# Patient Record
Sex: Male | Born: 1945 | Race: White | Hispanic: No | Marital: Married | Smoking: Never smoker
Health system: Southern US, Community
[De-identification: ages and names within clinical notes are randomized; demographics above are authoritative.]

---

## 2016-10-12 DIAGNOSIS — Z23 Encounter for immunization: Secondary | ICD-10-CM | POA: Diagnosis not present

## 2016-10-27 DIAGNOSIS — M109 Gout, unspecified: Secondary | ICD-10-CM | POA: Diagnosis not present

## 2016-10-27 DIAGNOSIS — Z6829 Body mass index (BMI) 29.0-29.9, adult: Secondary | ICD-10-CM | POA: Diagnosis not present

## 2016-10-27 DIAGNOSIS — I440 Atrioventricular block, first degree: Secondary | ICD-10-CM | POA: Diagnosis not present

## 2016-10-27 DIAGNOSIS — Z23 Encounter for immunization: Secondary | ICD-10-CM | POA: Diagnosis not present

## 2016-10-27 DIAGNOSIS — N400 Enlarged prostate without lower urinary tract symptoms: Secondary | ICD-10-CM | POA: Diagnosis not present

## 2017-07-13 DIAGNOSIS — Z23 Encounter for immunization: Secondary | ICD-10-CM | POA: Diagnosis not present

## 2017-11-05 DIAGNOSIS — J069 Acute upper respiratory infection, unspecified: Secondary | ICD-10-CM | POA: Diagnosis not present

## 2017-11-05 DIAGNOSIS — Z23 Encounter for immunization: Secondary | ICD-10-CM | POA: Diagnosis not present

## 2017-11-05 DIAGNOSIS — Z6829 Body mass index (BMI) 29.0-29.9, adult: Secondary | ICD-10-CM | POA: Diagnosis not present

## 2018-09-27 DIAGNOSIS — Z23 Encounter for immunization: Secondary | ICD-10-CM | POA: Diagnosis not present

## 2019-10-02 ENCOUNTER — Other Ambulatory Visit: Payer: Self-pay | Admitting: Internal Medicine

## 2019-10-02 ENCOUNTER — Ambulatory Visit
Admission: RE | Admit: 2019-10-02 | Discharge: 2019-10-02 | Disposition: A | Discharge status: Home / Self Care | Payer: Medicare Other | Source: Ambulatory Visit | Attending: Internal Medicine | Admitting: Internal Medicine

## 2019-10-02 DIAGNOSIS — M25562 Pain in left knee: Secondary | ICD-10-CM

## 2020-06-10 DIAGNOSIS — M5441 Lumbago with sciatica, right side: Secondary | ICD-10-CM | POA: Diagnosis not present

## 2020-06-19 DIAGNOSIS — M5441 Lumbago with sciatica, right side: Secondary | ICD-10-CM | POA: Diagnosis not present

## 2020-06-19 DIAGNOSIS — R2689 Other abnormalities of gait and mobility: Secondary | ICD-10-CM | POA: Diagnosis not present

## 2020-06-19 DIAGNOSIS — M6281 Muscle weakness (generalized): Secondary | ICD-10-CM | POA: Diagnosis not present

## 2020-06-21 DIAGNOSIS — R2689 Other abnormalities of gait and mobility: Secondary | ICD-10-CM | POA: Diagnosis not present

## 2020-06-21 DIAGNOSIS — M6281 Muscle weakness (generalized): Secondary | ICD-10-CM | POA: Diagnosis not present

## 2020-06-21 DIAGNOSIS — M5441 Lumbago with sciatica, right side: Secondary | ICD-10-CM | POA: Diagnosis not present

## 2020-06-26 DIAGNOSIS — M5441 Lumbago with sciatica, right side: Secondary | ICD-10-CM | POA: Diagnosis not present

## 2020-06-26 DIAGNOSIS — R2689 Other abnormalities of gait and mobility: Secondary | ICD-10-CM | POA: Diagnosis not present

## 2020-06-26 DIAGNOSIS — M6281 Muscle weakness (generalized): Secondary | ICD-10-CM | POA: Diagnosis not present

## 2020-06-29 DIAGNOSIS — Z03818 Encounter for observation for suspected exposure to other biological agents ruled out: Secondary | ICD-10-CM | POA: Diagnosis not present

## 2020-07-03 DIAGNOSIS — M5441 Lumbago with sciatica, right side: Secondary | ICD-10-CM | POA: Diagnosis not present

## 2020-07-03 DIAGNOSIS — R2689 Other abnormalities of gait and mobility: Secondary | ICD-10-CM | POA: Diagnosis not present

## 2020-07-03 DIAGNOSIS — M6281 Muscle weakness (generalized): Secondary | ICD-10-CM | POA: Diagnosis not present

## 2020-07-05 DIAGNOSIS — M5441 Lumbago with sciatica, right side: Secondary | ICD-10-CM | POA: Diagnosis not present

## 2020-07-05 DIAGNOSIS — M6281 Muscle weakness (generalized): Secondary | ICD-10-CM | POA: Diagnosis not present

## 2020-07-05 DIAGNOSIS — R2689 Other abnormalities of gait and mobility: Secondary | ICD-10-CM | POA: Diagnosis not present

## 2020-07-10 DIAGNOSIS — R2689 Other abnormalities of gait and mobility: Secondary | ICD-10-CM | POA: Diagnosis not present

## 2020-07-10 DIAGNOSIS — M5441 Lumbago with sciatica, right side: Secondary | ICD-10-CM | POA: Diagnosis not present

## 2020-07-10 DIAGNOSIS — M6281 Muscle weakness (generalized): Secondary | ICD-10-CM | POA: Diagnosis not present

## 2020-07-19 DIAGNOSIS — M6281 Muscle weakness (generalized): Secondary | ICD-10-CM | POA: Diagnosis not present

## 2020-07-19 DIAGNOSIS — M5441 Lumbago with sciatica, right side: Secondary | ICD-10-CM | POA: Diagnosis not present

## 2020-07-19 DIAGNOSIS — R2689 Other abnormalities of gait and mobility: Secondary | ICD-10-CM | POA: Diagnosis not present

## 2020-07-24 DIAGNOSIS — M5116 Intervertebral disc disorders with radiculopathy, lumbar region: Secondary | ICD-10-CM | POA: Diagnosis not present

## 2020-07-26 DIAGNOSIS — M6281 Muscle weakness (generalized): Secondary | ICD-10-CM | POA: Diagnosis not present

## 2020-07-26 DIAGNOSIS — M5441 Lumbago with sciatica, right side: Secondary | ICD-10-CM | POA: Diagnosis not present

## 2020-07-26 DIAGNOSIS — R2689 Other abnormalities of gait and mobility: Secondary | ICD-10-CM | POA: Diagnosis not present

## 2020-07-31 DIAGNOSIS — R2689 Other abnormalities of gait and mobility: Secondary | ICD-10-CM | POA: Diagnosis not present

## 2020-07-31 DIAGNOSIS — M5441 Lumbago with sciatica, right side: Secondary | ICD-10-CM | POA: Diagnosis not present

## 2020-07-31 DIAGNOSIS — M6281 Muscle weakness (generalized): Secondary | ICD-10-CM | POA: Diagnosis not present

## 2020-09-23 DIAGNOSIS — I2510 Atherosclerotic heart disease of native coronary artery without angina pectoris: Secondary | ICD-10-CM | POA: Diagnosis not present

## 2020-10-09 DIAGNOSIS — I2510 Atherosclerotic heart disease of native coronary artery without angina pectoris: Secondary | ICD-10-CM | POA: Diagnosis not present

## 2020-10-15 DIAGNOSIS — I2510 Atherosclerotic heart disease of native coronary artery without angina pectoris: Secondary | ICD-10-CM | POA: Diagnosis not present

## 2020-10-16 DIAGNOSIS — I2510 Atherosclerotic heart disease of native coronary artery without angina pectoris: Secondary | ICD-10-CM | POA: Diagnosis not present

## 2020-10-21 DIAGNOSIS — I2510 Atherosclerotic heart disease of native coronary artery without angina pectoris: Secondary | ICD-10-CM | POA: Diagnosis not present

## 2020-10-21 DIAGNOSIS — I442 Atrioventricular block, complete: Secondary | ICD-10-CM | POA: Diagnosis not present

## 2020-10-30 DIAGNOSIS — I2510 Atherosclerotic heart disease of native coronary artery without angina pectoris: Secondary | ICD-10-CM | POA: Diagnosis not present

## 2020-12-09 DIAGNOSIS — R7303 Prediabetes: Secondary | ICD-10-CM | POA: Diagnosis not present

## 2020-12-09 DIAGNOSIS — I7091 Generalized atherosclerosis: Secondary | ICD-10-CM | POA: Diagnosis not present

## 2020-12-09 DIAGNOSIS — Z0000 Encounter for general adult medical examination without abnormal findings: Secondary | ICD-10-CM | POA: Diagnosis not present

## 2020-12-09 DIAGNOSIS — Z1389 Encounter for screening for other disorder: Secondary | ICD-10-CM | POA: Diagnosis not present

## 2020-12-09 DIAGNOSIS — M109 Gout, unspecified: Secondary | ICD-10-CM | POA: Diagnosis not present

## 2021-01-06 ENCOUNTER — Other Ambulatory Visit: Payer: Self-pay

## 2021-01-06 ENCOUNTER — Ambulatory Visit: Payer: Medicare Other | Admitting: Internal Medicine

## 2021-01-06 ENCOUNTER — Encounter: Payer: Self-pay | Admitting: Internal Medicine

## 2021-01-06 VITALS — BP 122/70 | HR 59 | Ht 73.0 in | Wt 225.4 lb

## 2021-01-06 DIAGNOSIS — Z950 Presence of cardiac pacemaker: Secondary | ICD-10-CM | POA: Diagnosis not present

## 2021-01-06 DIAGNOSIS — I495 Sick sinus syndrome: Secondary | ICD-10-CM | POA: Diagnosis not present

## 2021-01-06 NOTE — Patient Instructions (Addendum)
Medication Instructions:  °Your physician recommends that you continue on your current medications as directed. Please refer to the Current Medication list given to you today. ° °Labwork: °None ordered. ° °Testing/Procedures: °None ordered. ° °Follow-Up: °Your physician wants you to follow-up in: one year with Gregg Taylor, MD  °You will receive a reminder letter in the mail two months in advance. If you don't receive a letter, please call our office to schedule the follow-up appointment. ° °Remote monitoring is used to monitor your Pacemaker from home. This monitoring reduces the number of office visits required to check your device to one time per year. It allows us to keep an eye on the functioning of your device to ensure it is working properly. You are scheduled for a device check from home on 04/07/2021. You may send your transmission at any time that day. If you have a wireless device, the transmission will be sent automatically. After your physician reviews your transmission, you will receive a postcard with your next transmission date. ° °Any Other Special Instructions Will Be Listed Below (If Applicable). ° °If you need a refill on your cardiac medications before your next appointment, please call your pharmacy.  ° ° ° ° °

## 2021-01-06 NOTE — Progress Notes (Signed)
HPI Anthony Huynh is referred today for evaluation and management of his DDD PM. 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 He feels well and denies chest pain or sob. He is active and has no limit. He had no trouble healing after his PPM insertion.  Allergies  Allergen Reactions   Bee Venom Other (See Comments)     Current Outpatient Medications  Medication Sig Dispense Refill   allopurinol (ZYLOPRIM) 100 MG tablet 1 tablet     aspirin 81 MG chewable tablet 1 tab     B Complex Vitamins (B COMPLEX 1 PO) Take 1 tablet by mouth See admin instructions.     Black Pepper-Turmeric (TURMERIC CURCUMIN) 05-998 MG CAPS See admin instructions.     Multiple Vitamin (MULTIVITAMIN ADULT) TABS 1 tablet     Omega-3 Fatty Acids (FISH OIL) 1000 MG CAPS 1 capsule     Pitavastatin Calcium (LIVALO) 2 MG TABS      TAMSULOSIN HCL PO      vitamin B-12 (CYANOCOBALAMIN) 100 MCG tablet 1 tablet     No current facility-administered medications for this visit.     History reviewed. No pertinent past medical history.  ROS:   All systems reviewed and negative except as noted in the HPI.   History reviewed. No pertinent surgical history.   History reviewed. No pertinent family history.   Social History   Socioeconomic History   Marital status: Married    Spouse name: Not on file   Number of children: Not on file   Years of education: Not on file   Highest education level: Not on file  Occupational History   Not on file  Tobacco Use   Smoking status: Never   Smokeless tobacco: Never  Vaping Use   Vaping Use: Never used  Substance and Sexual Activity   Alcohol use: Yes    Alcohol/week: 3.0 standard drinks    Types: 1 Glasses of wine, 1 Cans of beer, 1 Standard drinks or equivalent per week    Comment: daily   Drug use: Never   Sexual activity: Yes  Other  Topics Concern   Not on file  Social History Narrative   Not on file   Social Determinants of Health   Financial Resource Strain: Not on file  Food Insecurity: Not on file  Transportation Needs: Not on file  Physical Activity: Not on file  Stress: Not on file  Social Connections: Not on file  Intimate Partner Violence: Not on file     BP 122/70    Pulse (!) 59    Ht 6\' 1"  (1.854 m)    Wt 225 lb 6.4 oz (102.2 kg)    SpO2 97%    BMI 29.74 kg/m   Physical Exam:  Well appearing NAD HEENT: Unremarkable Neck:  No JVD, no thyromegally Lymphatics:  No adenopathy Back:  No CVA tenderness Lungs:  Clear HEART:  Regular rate rhythm, no murmurs, no rubs, no clicks Abd:  soft, positive bowel sounds, no organomegally, no rebound, no guarding Ext:  2 plus pulses, no edema, no cyanosis, no clubbing Skin:  No rashes no nodules Neuro:  CN II through XII intact, motor grossly intact  EKG - nsr with first degree AV block.   DEVICE  Normal device function.  See PaceArt for details.  Assess/Plan:  Heart block - he is pacing despite a prolonged AV delay and I have tightened up his AV delay. I asked him to call if he did not like the programming changes. 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 CAD - he has single vessel disease and denies anginal symptoms.   Anthony Gowda Jil Penland,MD

## 2021-01-08 ENCOUNTER — Telehealth: Payer: Self-pay

## 2021-01-08 NOTE — Telephone Encounter (Signed)
Successful telephone encounter to patient to inform patient his remote monitor has been ordered and will be delivered to his address. Discussed remote monitoring process. Patient appreciative of call. Will continue to monitor remotely as patient intermittently relocates to [place].

## 2021-01-14 ENCOUNTER — Other Ambulatory Visit: Payer: Self-pay

## 2021-01-14 ENCOUNTER — Encounter (HOSPITAL_COMMUNITY): Payer: Self-pay | Admitting: Emergency Medicine

## 2021-01-14 ENCOUNTER — Emergency Department (HOSPITAL_COMMUNITY)
Admission: EM | Admit: 2021-01-14 | Discharge: 2021-01-14 | Disposition: A | Discharge status: Home / Self Care | Payer: Medicare Other | Attending: Emergency Medicine | Admitting: Emergency Medicine

## 2021-01-14 ENCOUNTER — Emergency Department (HOSPITAL_COMMUNITY): Payer: Medicare Other

## 2021-01-14 DIAGNOSIS — Z7982 Long term (current) use of aspirin: Secondary | ICD-10-CM | POA: Insufficient documentation

## 2021-01-14 DIAGNOSIS — N179 Acute kidney failure, unspecified: Secondary | ICD-10-CM | POA: Diagnosis not present

## 2021-01-14 DIAGNOSIS — M542 Cervicalgia: Secondary | ICD-10-CM | POA: Diagnosis not present

## 2021-01-14 DIAGNOSIS — E860 Dehydration: Secondary | ICD-10-CM | POA: Insufficient documentation

## 2021-01-14 DIAGNOSIS — Z8546 Personal history of malignant neoplasm of prostate: Secondary | ICD-10-CM | POA: Diagnosis not present

## 2021-01-14 DIAGNOSIS — Z950 Presence of cardiac pacemaker: Secondary | ICD-10-CM | POA: Diagnosis not present

## 2021-01-14 DIAGNOSIS — R630 Anorexia: Secondary | ICD-10-CM | POA: Insufficient documentation

## 2021-01-14 DIAGNOSIS — S199XXA Unspecified injury of neck, initial encounter: Secondary | ICD-10-CM | POA: Diagnosis not present

## 2021-01-14 DIAGNOSIS — Z743 Need for continuous supervision: Secondary | ICD-10-CM | POA: Diagnosis not present

## 2021-01-14 DIAGNOSIS — J3489 Other specified disorders of nose and nasal sinuses: Secondary | ICD-10-CM | POA: Diagnosis not present

## 2021-01-14 DIAGNOSIS — S00532A Contusion of oral cavity, initial encounter: Secondary | ICD-10-CM | POA: Insufficient documentation

## 2021-01-14 DIAGNOSIS — S0083XA Contusion of other part of head, initial encounter: Secondary | ICD-10-CM | POA: Diagnosis not present

## 2021-01-14 DIAGNOSIS — J329 Chronic sinusitis, unspecified: Secondary | ICD-10-CM | POA: Diagnosis not present

## 2021-01-14 DIAGNOSIS — W01198A Fall on same level from slipping, tripping and stumbling with subsequent striking against other object, initial encounter: Secondary | ICD-10-CM | POA: Insufficient documentation

## 2021-01-14 DIAGNOSIS — R404 Transient alteration of awareness: Secondary | ICD-10-CM | POA: Diagnosis not present

## 2021-01-14 DIAGNOSIS — R55 Syncope and collapse: Secondary | ICD-10-CM | POA: Insufficient documentation

## 2021-01-14 DIAGNOSIS — S0990XA Unspecified injury of head, initial encounter: Secondary | ICD-10-CM | POA: Diagnosis present

## 2021-01-14 LAB — URINALYSIS, ROUTINE W REFLEX MICROSCOPIC
Bilirubin Urine: NEGATIVE
Glucose, UA: NEGATIVE mg/dL
Hgb urine dipstick: NEGATIVE
Ketones, ur: NEGATIVE mg/dL
Leukocytes,Ua: NEGATIVE
Nitrite: NEGATIVE
Protein, ur: NEGATIVE mg/dL
Specific Gravity, Urine: 1.02 (ref 1.005–1.030)
pH: 6 (ref 5.0–8.0)

## 2021-01-14 LAB — BASIC METABOLIC PANEL
Anion gap: 6 (ref 5–15)
BUN: 14 mg/dL (ref 8–23)
CO2: 25 mmol/L (ref 22–32)
Calcium: 8.3 mg/dL — ABNORMAL LOW (ref 8.9–10.3)
Chloride: 105 mmol/L (ref 98–111)
Creatinine, Ser: 1.29 mg/dL — ABNORMAL HIGH (ref 0.61–1.24)
GFR, Estimated: 57 mL/min — ABNORMAL LOW (ref >60)
Glucose, Bld: 102 mg/dL — ABNORMAL HIGH (ref 70–99)
Potassium: 3.6 mmol/L (ref 3.5–5.1)
Sodium: 136 mmol/L (ref 135–145)

## 2021-01-14 LAB — CBC
HCT: 41.3 % (ref 39.0–52.0)
Hemoglobin: 14 g/dL (ref 13.0–17.0)
MCH: 32.1 pg (ref 26.0–34.0)
MCHC: 33.9 g/dL (ref 30.0–36.0)
MCV: 94.7 fL (ref 80.0–100.0)
Platelets: 142 10*3/uL — ABNORMAL LOW (ref 150–400)
RBC: 4.36 MIL/uL (ref 4.22–5.81)
RDW: 12.1 % (ref 11.5–15.5)
WBC: 8 10*3/uL (ref 4.0–10.5)
nRBC: 0 % (ref 0.0–0.2)

## 2021-01-14 LAB — CBG MONITORING, ED: Glucose-Capillary: 107 mg/dL — ABNORMAL HIGH (ref 70–99)

## 2021-01-14 MED ORDER — IBUPROFEN 200 MG PO TABS
400.0000 mg | ORAL_TABLET | Freq: Once | ORAL | Status: DC
  Filled 2021-01-14: qty 2

## 2021-01-14 MED ORDER — PSEUDOEPHEDRINE HCL 15 MG/5ML PO LIQD
30.0000 mg | Freq: Once | ORAL | Status: AC
  Administered 2021-01-14: 04:00:00 30 mg via ORAL
  Filled 2021-01-14: qty 10

## 2021-01-14 MED ORDER — OXYCODONE-ACETAMINOPHEN 5-325 MG PO TABS
2.0000 | ORAL_TABLET | Freq: Once | ORAL | Status: AC
  Administered 2021-01-14: 03:00:00 2 via ORAL
  Filled 2021-01-14: qty 2

## 2021-01-14 MED ORDER — LACTATED RINGERS IV BOLUS
1000.0000 mL | Freq: Once | INTRAVENOUS | Status: AC
  Administered 2021-01-14: 02:00:00 1000 mL via INTRAVENOUS

## 2021-01-14 NOTE — ED Notes (Signed)
No arrhythmias detected since 01/07/2021. Batteries all charged. Dual chamber pacemaker working perfect. Pacemaker  has "Optivol" feature that monitors intrathoracic impedance and is trending up per [employer] staff.

## 2021-01-14 NOTE — ED Notes (Addendum)
Pt ambulatory to restroom denies dizziness but says he doesn't feel like he is at baseline.

## 2021-01-14 NOTE — ED Provider Notes (Signed)
[facility]EMERGENCY DEPT Provider Note   CSN: [id number] Arrival date & time: 01/14/21  0116     History Chief Complaint  Patient presents with   Fall   Loss of Consciousness    Anthony Huynh. is a 75 y.o. male.  75 year old male the presents emerged from today after a fall.  Patient states that he often gets up to urinate at night secondary to prostate cancer and enlarged prostate.  Patient presumes that is what is happening tonight.  He remembers sitting on the bed but then he woke up on the ground.  He states that he hit the front of his head on what he thinks was his nightstand but he also has some lower cervical spine pain and tenderness.  States he feels like he strained his left shoulder.  Had a pacemaker placed for sick sinus syndrome back in October without any issues since then.  Has had a sinus infection recently but without fever.  Has had decreased p.o. intake and complains of dry mouth at this time but no dark urine or change in urination patterns.  Patient has no chest pain or back pain.  He has the pain with a trauma to his forehead but no other headaches.  No palpitations.  No other associated symptoms.   Fall  Loss of Consciousness     History reviewed. No pertinent past medical history.  Patient Active Problem List   Diagnosis Date Noted   Sick sinus syndrome (HCC) 01/06/2021   Pacemaker 01/06/2021    History reviewed. No pertinent surgical history.     History reviewed. No pertinent family history.  Social History   Tobacco Use   Smoking status: Never   Smokeless tobacco: Never  Vaping Use   Vaping Use: Never used  Substance Use Topics   Alcohol use: Yes    Alcohol/week: 3.0 standard drinks    Types: 1 Glasses of wine, 1 Cans of beer, 1 Standard drinks or equivalent per week    Comment: daily   Drug use: Never    Home Medications Prior to Admission medications   Medication Sig Start Date End Date Taking?  Authorizing Provider  allopurinol (ZYLOPRIM) 100 MG tablet Take 100 mg by mouth daily.   Yes [provider]  aspirin 81 MG chewable tablet Chew 81 mg by mouth daily.   Yes [provider]  B Complex Vitamins (B COMPLEX 1 PO) Take 1 tablet by mouth daily.   Yes [provider]  Black Pepper-Turmeric (TURMERIC CURCUMIN) 05-998 MG CAPS Take 1 capsule by mouth daily.   Yes [provider]  Coenzyme Q10 (CO Q-10 PO) Take 1 capsule by mouth daily.   Yes [provider]  Multiple Vitamin (MULTIVITAMIN ADULT) TABS Take 1 tablet by mouth daily.   Yes [provider]  Omega-3 Fatty Acids (FISH OIL) 1000 MG CAPS Take 1,000 mg by mouth daily.   Yes [provider]  Pitavastatin Calcium (LIVALO) 2 MG TABS Take 2 mg by mouth daily. 10/19/20  Yes [provider]  tamsulosin (FLOMAX) 0.4 MG CAPS capsule Take 0.4 mg by mouth daily. 12/17/19  Yes [provider]  vitamin B-12 (CYANOCOBALAMIN) 100 MCG tablet Take 100 mcg by mouth daily.   Yes [provider]    Allergies    Bee venom  Review of Systems   Review of Systems  Cardiovascular:  Positive for syncope.  All other systems reviewed and are negative.  Physical Exam Updated Vital  Signs BP (!) 167/94    Pulse 71    Temp 98.2 F (36.8 C) (Oral)    Resp 20    Ht 6\' 1"  (1.854 m)    Wt 102 kg    SpO2 97%    BMI 29.67 kg/m   Physical Exam Vitals and nursing note reviewed.  Constitutional:      Appearance: He is well-developed.  HENT:     Head: Normocephalic.     Comments: Ecchymosis and dried blood to his right forehead with abrasions but no obvious lacerations    Nose: Nose normal. No congestion or rhinorrhea.     Mouth/Throat:     Mouth: Mucous membranes are dry.     Pharynx: Oropharynx is clear.     Comments: Bruising all around the periphery of his tongue without laceration. Eyes:     Pupils: Pupils are equal, round, and reactive to light.   Cardiovascular:     Rate and Rhythm: Normal rate.  Pulmonary:     Effort: Pulmonary effort is normal. No respiratory distress.  Abdominal:     General: There is no distension.  Musculoskeletal:        General: Tenderness (Left paracervical and C6/7 midline area) present. Normal range of motion.     Cervical back: Normal range of motion.  Skin:    General: Skin is warm and dry.  Neurological:     General: No focal deficit present.     Mental Status: He is alert and oriented to person, place, and time.    ED Results / Procedures / Treatments   Labs (all labs ordered are listed, but only abnormal results are displayed) Labs Reviewed  BASIC METABOLIC PANEL - Abnormal; Notable for the following components:      Result Value   Glucose, Bld 102 (*)    Creatinine, Ser 1.29 (*)    Calcium 8.3 (*)    GFR, Estimated 57 (*)    All other components within normal limits  CBC - Abnormal; Notable for the following components:   Platelets 142 (*)    All other components within normal limits  CBG MONITORING, ED - Abnormal; Notable for the following components:   Glucose-Capillary 107 (*)    All other components within normal limits  URINALYSIS, ROUTINE W REFLEX MICROSCOPIC  CBG MONITORING, ED    EKG EKG Interpretation  Date/Time:  Tuesday January 14 2021 01:34:27 EST Ventricular Rate:  62 PR Interval:  208 QRS Duration: 100 QT Interval:  440 QTC Calculation: 446 R Axis:   -71 Text Interpretation: AV dual-paced rhythm Abnormal ECG Confirmed by [id number] [phone]) on 01/14/2021 4:33:52 AM  Radiology CT Head Wo Contrast  Result Date: 01/14/2021 CLINICAL DATA:  Trauma. EXAM: CT HEAD WITHOUT CONTRAST CT CERVICAL SPINE WITHOUT CONTRAST TECHNIQUE: Multidetector CT imaging of the head and cervical spine was performed following the standard protocol without intravenous contrast. Multiplanar CT image reconstructions of the cervical spine were also generated. COMPARISON:  None. FINDINGS:  CT HEAD FINDINGS Brain: The ventricles and sulci are appropriate size for the patient's age. The gray-white matter discrimination is preserved. There is no acute intracranial hemorrhage. No mass effect or midline shift. No extra-axial fluid collection. Vascular: No hyperdense vessel or unexpected calcification. Skull: Normal. Negative for fracture or focal lesion. Sinuses/Orbits: There is diffuse mucoperiosteal thickening of paranasal sinuses and opacification of the right frontal sinus. Small left maxillary sinus air-fluid level. The mastoid air cells are clear. Other: Mild right forehead contusion. CT CERVICAL SPINE  FINDINGS Alignment: No acute subluxation. Skull base and vertebrae: No acute fracture.  Osteopenia. Soft tissues and spinal canal: No prevertebral fluid or swelling. No visible canal hematoma. Disc levels:  No acute findings.  Mild degenerative changes. Upper chest: Negative. Other: Bilateral carotid bulb calcified plaques. Partially visualized pacemaker wires. A 2 cm left thyroid hypodense lesion. In the setting of significant comorbidities or limited life expectancy, no follow-up recommended (ref: J Am Coll Radiol. 2015 Feb;12(2): 143-50). IMPRESSION: 1. No acute intracranial pathology. 2. No acute/traumatic cervical spine pathology. 3. Paranasal sinus disease. Electronically Signed   By: Elgie Collard M.D.   On: 01/14/2021 04:00   CT Cervical Spine Wo Contrast  Result Date: 01/14/2021 CLINICAL DATA:  Trauma. EXAM: CT HEAD WITHOUT CONTRAST CT CERVICAL SPINE WITHOUT CONTRAST TECHNIQUE: Multidetector CT imaging of the head and cervical spine was performed following the standard protocol without intravenous contrast. Multiplanar CT image reconstructions of the cervical spine were also generated. COMPARISON:  None. FINDINGS: CT HEAD FINDINGS Brain: The ventricles and sulci are appropriate size for the patient's age. The gray-white matter discrimination is preserved. There is no acute intracranial  hemorrhage. No mass effect or midline shift. No extra-axial fluid collection. Vascular: No hyperdense vessel or unexpected calcification. Skull: Normal. Negative for fracture or focal lesion. Sinuses/Orbits: There is diffuse mucoperiosteal thickening of paranasal sinuses and opacification of the right frontal sinus. Small left maxillary sinus air-fluid level. The mastoid air cells are clear. Other: Mild right forehead contusion. CT CERVICAL SPINE FINDINGS Alignment: No acute subluxation. Skull base and vertebrae: No acute fracture.  Osteopenia. Soft tissues and spinal canal: No prevertebral fluid or swelling. No visible canal hematoma. Disc levels:  No acute findings.  Mild degenerative changes. Upper chest: Negative. Other: Bilateral carotid bulb calcified plaques. Partially visualized pacemaker wires. A 2 cm left thyroid hypodense lesion. In the setting of significant comorbidities or limited life expectancy, no follow-up recommended (ref: J Am Coll Radiol. 2015 Feb;12(2): 143-50). IMPRESSION: 1. No acute intracranial pathology. 2. No acute/traumatic cervical spine pathology. 3. Paranasal sinus disease. Electronically Signed   By: Elgie Collard M.D.   On: 01/14/2021 04:00    Procedures Procedures   Medications Ordered in ED Medications  ibuprofen (ADVIL) tablet 400 mg (400 mg Oral Not Given 01/14/21 0403)  lactated ringers bolus 1,000 mL (0 mLs Intravenous Stopped 01/14/21 0404)  oxyCODONE-acetaminophen (PERCOCET/ROXICET) 5-325 MG per tablet 2 tablet (2 tablets Oral Given 01/14/21 0327)  pseudoephedrine (SUDAFED) 15 MG/5ML liquid 30 mg (30 mg Oral Given 01/14/21 0357)    ED Course  I have reviewed the triage vital signs and the nursing notes.  Pertinent labs & imaging results that were available during my care of the patient were reviewed by me and considered in my medical decision making (see chart for details).    MDM Rules/Calculators/A&P                         Suspect patient had  hypovolemic syncope and bit his tongue on the way down.  Wife does not report any seizure-like activity or postictal state.  Blood sugars with EMS and here are normal.  With a tongue biting could also consider seizure however seems less likely in his situation.  Will CT for injuries and otherwise normal syncopal work-up along with interrogate pacemaker.  Patient will likely be stable for discharge if all this is reassuring.  Work-up is reassuring as above.  Mild AKI but will follow-up with his doctor  to reevaluate that about a week.  No other acute or worsening symptoms.   Final Clinical Impression(s) / ED Diagnoses Final diagnoses:  Syncope and collapse  Dehydration    Rx / DC Orders ED Discharge Orders     None        Alohilani Levenhagen, Barbara Cower, MD 01/14/21 [phone]

## 2021-01-14 NOTE — ED Notes (Signed)
Labeled specimen cup provided to pt for urine collection per MD order. ENMiles 

## 2021-01-14 NOTE — ED Notes (Signed)
[employer]tronic pacemaker interrogated. Transmission send completed.

## 2021-01-14 NOTE — ED Triage Notes (Signed)
Pt arrived via EMS from home. Pt lost consciousness and fell, hitting the right side of his head. Pt had normal CBG. Pt is A&Ox4. Pt reports sinus congestion for the past 2 days

## 2021-01-21 DIAGNOSIS — M25512 Pain in left shoulder: Secondary | ICD-10-CM | POA: Diagnosis not present

## 2021-01-21 DIAGNOSIS — N179 Acute kidney failure, unspecified: Secondary | ICD-10-CM | POA: Diagnosis not present

## 2021-01-29 DIAGNOSIS — R350 Frequency of micturition: Secondary | ICD-10-CM | POA: Diagnosis not present

## 2021-02-03 ENCOUNTER — Telehealth: Payer: Self-pay | Admitting: Internal Medicine

## 2021-02-03 NOTE — Telephone Encounter (Signed)
° ° °  1. Has your device fired?   2. Is you device beeping?   3. Are you experiencing draining or swelling at device site?   4. Are you calling to see if we received your device transmission? Pt would like to r/s his transmission appt to 05/12/21 he said he will ne out of the country. Also, he wanted to know how to use his hand held monitor   5. Have you passed out?     Please route to Device Clinic Pool

## 2021-02-04 ENCOUNTER — Other Ambulatory Visit: Payer: Self-pay | Admitting: Interventional Cardiology

## 2021-02-04 ENCOUNTER — Ambulatory Visit (INDEPENDENT_AMBULATORY_CARE_PROVIDER_SITE_OTHER): Payer: Medicare Other

## 2021-02-04 ENCOUNTER — Telehealth: Payer: Self-pay

## 2021-02-04 DIAGNOSIS — I4891 Unspecified atrial fibrillation: Secondary | ICD-10-CM

## 2021-02-04 DIAGNOSIS — I495 Sick sinus syndrome: Secondary | ICD-10-CM | POA: Diagnosis not present

## 2021-02-04 LAB — CUP PACEART REMOTE DEVICE CHECK
Battery Remaining Longevity: 164 mo
Battery Voltage: 3.2 V
Brady Statistic AP VP Percent: 39.45 %
Brady Statistic AP VS Percent: 0 %
Brady Statistic AS VP Percent: 60.18 %
Brady Statistic AS VS Percent: 0.29 %
Brady Statistic RA Percent Paced: 26.71 %
Brady Statistic RV Percent Paced: 94.84 %
Date Time Interrogation Session: 20230116153413
Implantable Lead Implant Date: 20221005
Implantable Lead Implant Date: 20221005
Implantable Lead Location: 753859
Implantable Lead Location: 753860
Implantable Lead Model: 3830
Implantable Lead Model: 4574
Implantable Pulse Generator Implant Date: 20221005
Lead Channel Impedance Value: 342 Ohm
Lead Channel Impedance Value: 399 Ohm
Lead Channel Impedance Value: 437 Ohm
Lead Channel Impedance Value: 551 Ohm
Lead Channel Pacing Threshold Amplitude: 0.375 V
Lead Channel Pacing Threshold Amplitude: 0.75 V
Lead Channel Pacing Threshold Pulse Width: 0.4 ms
Lead Channel Pacing Threshold Pulse Width: 0.4 ms
Lead Channel Sensing Intrinsic Amplitude: 14.125 mV
Lead Channel Sensing Intrinsic Amplitude: 14.125 mV
Lead Channel Sensing Intrinsic Amplitude: 5.125 mV
Lead Channel Sensing Intrinsic Amplitude: 5.125 mV
Lead Channel Setting Pacing Amplitude: 1.5 V
Lead Channel Setting Pacing Amplitude: 2 V
Lead Channel Setting Pacing Pulse Width: 0.4 ms
Lead Channel Setting Sensing Sensitivity: 0.9 mV

## 2021-02-04 MED ORDER — APIXABAN 5 MG PO TABS: 5.0000 mg | ORAL_TABLET | Freq: Two times a day (BID) | ORAL | 0 refills | Status: DC

## 2021-02-04 MED ORDER — APIXABAN 2.5 MG PO TABS: 5.0000 mg | ORAL_TABLET | Freq: Two times a day (BID) | ORAL | Status: DC

## 2021-02-04 NOTE — Telephone Encounter (Signed)
Patient does not have a part D plan. He has 2 months of Eliquis (we gave him 1 month and a coupon for a free 1 month) He lives part time in [place] and has a doctor over there. He is going to try to see if medication is cheaper over there.

## 2021-02-04 NOTE — Telephone Encounter (Signed)
Prescription refill request for Eliquis received.  Last office visit: Anthony Huynh, 01/06/2021 Scr: 1.29, 01/14/2021 Age: 76 yo  Weight: 102 kg

## 2021-02-04 NOTE — Telephone Encounter (Signed)
I spoke with the patient and let him know that he can use the monitor every 91 days to send his remote transmissions. I told him when he feels like he is having an arrhythmia he can use the monitor than as well. He can do the remote any time of the day and he do not have to call or come to the office to do the transmission. I let him know the monitor will work wherever there is a cell tower. He wanted to change his April appointment. I change it for him. The patient verbalized understanding and thanked me for my help.

## 2021-02-04 NOTE — Telephone Encounter (Addendum)
Scheduled remote reviewed. Normal device function.   AF burden 32%, 1 persistent episode on 01/21/21 with duration approx 7 days per cardiac compass (new onset, no history noted in record). Rates controlled. 1 NSVT, 8 beats. Routed for further review of AF       Spoke with patient offered appointment next week, patient stated he was flying out to [place] tomorrow afternoon,and would be in [place] for 4 months. No appointments available to fit patients time frame,patient stated that he could not delay his trip.   Patients history reviewed with Dr Eldridge Dace, recommended for patient to start Eliquis 5mg  BID, stop ASA 81mg . Reviewed bleeding precautions and when to seek medical attention with patient, patient voiced understanding.   Patient advised to keep our practice informed via MyChart as patient does have a cardiologist in patient voiced understanding.

## 2021-02-05 NOTE — Telephone Encounter (Signed)
Hopefully, he can f/u with his physician in [place] also.  Will include Dr. Ladona Ridgel on the message so he is aware Eliquis was started for stroke prevention.  Aspirin was stopped.  Details of the CAD were unclear.  No known recent PCI so will plan on Eliquis alone.

## 2021-02-06 ENCOUNTER — Telehealth: Payer: Self-pay | Admitting: Internal Medicine

## 2021-02-06 NOTE — Telephone Encounter (Signed)
LMOVM with the information [employer] is requesting. I left device clinic direct number to call if he has any further questions.

## 2021-02-06 NOTE — Telephone Encounter (Signed)
Joven from [employer] calling to verify the date on implantation, managing doctor and the medtronic leads serial number. They state it is okay to leave a voicemail with the information. Phone: [phone]

## 2021-02-17 NOTE — Progress Notes (Signed)
Remote pacemaker transmission.   

## 2021-03-03 ENCOUNTER — Other Ambulatory Visit: Payer: Self-pay | Admitting: Interventional Cardiology

## 2021-03-03 NOTE — Telephone Encounter (Signed)
Prescription refill request for Eliquis received. Indication: afib  Last office visit: 01/06/2021, Ladona Ridgel Scr: 1.29, 01/14/2021 Age: 76 yo  Weight: 102 kg   See telephone note from 02/04/2021

## 2021-05-05 ENCOUNTER — Ambulatory Visit (INDEPENDENT_AMBULATORY_CARE_PROVIDER_SITE_OTHER): Payer: Medicare Other

## 2021-05-05 DIAGNOSIS — I4891 Unspecified atrial fibrillation: Secondary | ICD-10-CM | POA: Diagnosis not present

## 2021-05-05 DIAGNOSIS — I4892 Unspecified atrial flutter: Secondary | ICD-10-CM

## 2021-05-07 LAB — CUP PACEART REMOTE DEVICE CHECK
Battery Remaining Longevity: 161 mo
Battery Voltage: 3.17 V
Brady Statistic AP VP Percent: 39.52 %
Brady Statistic AP VS Percent: 0.01 %
Brady Statistic AS VP Percent: 60.04 %
Brady Statistic AS VS Percent: 0.44 %
Brady Statistic RA Percent Paced: 39.43 %
Brady Statistic RV Percent Paced: 99.55 %
Date Time Interrogation Session: 20230418120620
Implantable Lead Implant Date: 20221005
Implantable Lead Implant Date: 20221005
Implantable Lead Location: 753859
Implantable Lead Location: 753860
Implantable Lead Model: 3830
Implantable Lead Model: 4574
Implantable Pulse Generator Implant Date: 20221005
Lead Channel Impedance Value: 399 Ohm
Lead Channel Impedance Value: 456 Ohm
Lead Channel Impedance Value: 494 Ohm
Lead Channel Impedance Value: 551 Ohm
Lead Channel Pacing Threshold Amplitude: 0.375 V
Lead Channel Pacing Threshold Amplitude: 0.875 V
Lead Channel Pacing Threshold Pulse Width: 0.4 ms
Lead Channel Pacing Threshold Pulse Width: 0.4 ms
Lead Channel Sensing Intrinsic Amplitude: 20.875 mV
Lead Channel Sensing Intrinsic Amplitude: 20.875 mV
Lead Channel Sensing Intrinsic Amplitude: 6.5 mV
Lead Channel Sensing Intrinsic Amplitude: 6.5 mV
Lead Channel Setting Pacing Amplitude: 1.5 V
Lead Channel Setting Pacing Amplitude: 2 V
Lead Channel Setting Pacing Pulse Width: 0.4 ms
Lead Channel Setting Sensing Sensitivity: 0.9 mV

## 2021-05-22 NOTE — Progress Notes (Signed)
Remote pacemaker transmission.   

## 2021-06-24 DIAGNOSIS — E785 Hyperlipidemia, unspecified: Secondary | ICD-10-CM | POA: Diagnosis not present

## 2021-06-24 DIAGNOSIS — I480 Paroxysmal atrial fibrillation: Secondary | ICD-10-CM | POA: Diagnosis not present

## 2021-06-24 DIAGNOSIS — I10 Essential (primary) hypertension: Secondary | ICD-10-CM | POA: Diagnosis not present

## 2021-06-24 DIAGNOSIS — I7091 Generalized atherosclerosis: Secondary | ICD-10-CM | POA: Diagnosis not present

## 2021-06-24 DIAGNOSIS — Z950 Presence of cardiac pacemaker: Secondary | ICD-10-CM | POA: Diagnosis not present

## 2021-06-24 DIAGNOSIS — M109 Gout, unspecified: Secondary | ICD-10-CM | POA: Diagnosis not present

## 2021-06-24 DIAGNOSIS — D6869 Other thrombophilia: Secondary | ICD-10-CM | POA: Diagnosis not present

## 2021-07-14 DIAGNOSIS — R350 Frequency of micturition: Secondary | ICD-10-CM | POA: Diagnosis not present

## 2021-08-02 DIAGNOSIS — R519 Headache, unspecified: Secondary | ICD-10-CM | POA: Diagnosis not present

## 2021-08-02 DIAGNOSIS — R21 Rash and other nonspecific skin eruption: Secondary | ICD-10-CM | POA: Diagnosis not present

## 2021-08-02 DIAGNOSIS — R1110 Vomiting, unspecified: Secondary | ICD-10-CM | POA: Diagnosis not present

## 2021-08-03 DIAGNOSIS — R21 Rash and other nonspecific skin eruption: Secondary | ICD-10-CM | POA: Diagnosis not present

## 2021-08-05 ENCOUNTER — Ambulatory Visit (INDEPENDENT_AMBULATORY_CARE_PROVIDER_SITE_OTHER): Payer: Medicare Other

## 2021-08-05 DIAGNOSIS — I495 Sick sinus syndrome: Secondary | ICD-10-CM | POA: Diagnosis not present

## 2021-08-07 ENCOUNTER — Telehealth: Payer: Self-pay | Admitting: Internal Medicine

## 2021-08-07 NOTE — Telephone Encounter (Signed)
Patient reports he will be out of the country in [place] until October. Patient advised he should use mychart to communicate since he will not have a the same phone number to communicate. Assisted patient with mychart.

## 2021-08-07 NOTE — Telephone Encounter (Signed)
   1. Has your device fired?   2. Is you device beeping?   3. Are you experiencing draining or swelling at device site?   4. Are you calling to see if we received your device transmission? Yes, also, pt wants to speak with a nurse regarding the transmission he sent today. He said, he is leaving out of the country next week and wants to know the result   5. Have you passed out?     Please route to Device Clinic Pool

## 2021-08-08 ENCOUNTER — Telehealth: Payer: Self-pay

## 2021-08-08 LAB — CUP PACEART REMOTE DEVICE CHECK
Battery Remaining Longevity: 157 mo
Battery Voltage: 3.13 V
Brady Statistic AP VP Percent: 48.1 %
Brady Statistic AP VS Percent: 0.01 %
Brady Statistic AS VP Percent: 51.69 %
Brady Statistic AS VS Percent: 0.16 %
Brady Statistic RA Percent Paced: 39.31 %
Brady Statistic RV Percent Paced: 97.99 %
Date Time Interrogation Session: 20230720143027
Implantable Lead Implant Date: 20221005
Implantable Lead Implant Date: 20221005
Implantable Lead Location: 753859
Implantable Lead Location: 753860
Implantable Lead Model: 3830
Implantable Lead Model: 4574
Implantable Pulse Generator Implant Date: 20221005
Lead Channel Impedance Value: 418 Ohm
Lead Channel Impedance Value: 418 Ohm
Lead Channel Impedance Value: 456 Ohm
Lead Channel Impedance Value: 551 Ohm
Lead Channel Pacing Threshold Amplitude: 0.375 V
Lead Channel Pacing Threshold Amplitude: 0.875 V
Lead Channel Pacing Threshold Pulse Width: 0.4 ms
Lead Channel Pacing Threshold Pulse Width: 0.4 ms
Lead Channel Sensing Intrinsic Amplitude: 17.5 mV
Lead Channel Sensing Intrinsic Amplitude: 17.5 mV
Lead Channel Sensing Intrinsic Amplitude: 5.75 mV
Lead Channel Sensing Intrinsic Amplitude: 5.75 mV
Lead Channel Setting Pacing Amplitude: 1.5 V
Lead Channel Setting Pacing Amplitude: 2 V
Lead Channel Setting Pacing Pulse Width: 0.4 ms
Lead Channel Setting Sensing Sensitivity: 0.9 mV

## 2021-08-08 NOTE — Telephone Encounter (Signed)
error 

## 2021-08-11 DIAGNOSIS — B029 Zoster without complications: Secondary | ICD-10-CM | POA: Diagnosis not present

## 2021-09-02 NOTE — Progress Notes (Signed)
Remote pacemaker transmission.   

## 2021-10-18 ENCOUNTER — Encounter: Payer: Self-pay | Admitting: Internal Medicine

## 2021-10-27 ENCOUNTER — Encounter: Payer: Self-pay | Admitting: Internal Medicine

## 2021-11-04 ENCOUNTER — Ambulatory Visit (INDEPENDENT_AMBULATORY_CARE_PROVIDER_SITE_OTHER): Payer: Medicare Other

## 2021-11-04 DIAGNOSIS — I495 Sick sinus syndrome: Secondary | ICD-10-CM | POA: Diagnosis not present

## 2021-11-04 LAB — CUP PACEART REMOTE DEVICE CHECK
Battery Remaining Longevity: 157 mo
Battery Voltage: 3.08 V
Brady Statistic AP VP Percent: 50.99 %
Brady Statistic AP VS Percent: 0.01 %
Brady Statistic AS VP Percent: 48.79 %
Brady Statistic AS VS Percent: 0.09 %
Brady Statistic RA Percent Paced: 9.34 %
Brady Statistic RV Percent Paced: 90.91 %
Date Time Interrogation Session: 20231017162645
Implantable Lead Implant Date: 20221005
Implantable Lead Implant Date: 20221005
Implantable Lead Location: 753859
Implantable Lead Location: 753860
Implantable Lead Model: 3830
Implantable Lead Model: 4574
Implantable Pulse Generator Implant Date: 20221005
Lead Channel Impedance Value: 418 Ohm
Lead Channel Impedance Value: 418 Ohm
Lead Channel Impedance Value: 456 Ohm
Lead Channel Impedance Value: 570 Ohm
Lead Channel Pacing Threshold Amplitude: 0.375 V
Lead Channel Pacing Threshold Amplitude: 0.75 V
Lead Channel Pacing Threshold Pulse Width: 0.4 ms
Lead Channel Pacing Threshold Pulse Width: 0.4 ms
Lead Channel Sensing Intrinsic Amplitude: 18.5 mV
Lead Channel Sensing Intrinsic Amplitude: 18.5 mV
Lead Channel Sensing Intrinsic Amplitude: 5.375 mV
Lead Channel Sensing Intrinsic Amplitude: 5.375 mV
Lead Channel Setting Pacing Amplitude: 1.5 V
Lead Channel Setting Pacing Amplitude: 2 V
Lead Channel Setting Pacing Pulse Width: 0.4 ms
Lead Channel Setting Sensing Sensitivity: 0.9 mV

## 2021-11-19 ENCOUNTER — Telehealth: Payer: Self-pay | Admitting: Internal Medicine

## 2021-11-19 ENCOUNTER — Encounter: Payer: Self-pay | Admitting: Internal Medicine

## 2021-11-19 NOTE — Telephone Encounter (Signed)
Attempted to contact patient to advise results of recent remote. No answer, LMTCB.  Please advise patient below.

## 2021-11-19 NOTE — Telephone Encounter (Signed)
Patient called back and I let him know his results. Patient verbalized understanding

## 2021-11-19 NOTE — Progress Notes (Signed)
Remote pacemaker transmission.   

## 2021-11-19 NOTE — Telephone Encounter (Signed)
Pt would like a callback regarding remote check results. Please advise 

## 2021-11-20 DIAGNOSIS — R350 Frequency of micturition: Secondary | ICD-10-CM | POA: Diagnosis not present

## 2021-11-25 ENCOUNTER — Ambulatory Visit: Payer: Medicare Other | Attending: Internal Medicine | Admitting: Internal Medicine

## 2021-11-25 ENCOUNTER — Encounter: Payer: Self-pay | Admitting: Internal Medicine

## 2021-11-25 VITALS — BP 114/66 | HR 61 | Ht 72.0 in | Wt 217.4 lb

## 2021-11-25 DIAGNOSIS — Z950 Presence of cardiac pacemaker: Secondary | ICD-10-CM | POA: Diagnosis not present

## 2021-11-25 DIAGNOSIS — I495 Sick sinus syndrome: Secondary | ICD-10-CM

## 2021-11-25 NOTE — Patient Instructions (Addendum)
Medication Instructions:  Your physician has recommended you make the following change in your medication: STOP PROPAFENONE    (    RYTHMOL ) TODAY.    Lab Work: None ordered.  If you have labs (blood work) drawn today and your tests are completely normal, you will receive your results only by: [place] (if you have MyChart) OR A paper copy in the mail If you have any lab test that is abnormal or we need to change your treatment, we will call you to review the results.  Testing/Procedures: None ordered.  Follow-Up: At [facility], you and your health needs are our priority.  As part of our continuing mission to provide you with exceptional heart care, we have created designated Provider Care Teams.  These Care Teams include your primary Cardiologist (physician) and Advanced Practice Providers (APPs -  Physician Assistants and Nurse Practitioners) who all work together to provide you with the care you need, when you need it.  We recommend signing up for the patient portal called "MyChart".  Sign up information is provided on this After Visit Summary.  MyChart is used to connect with patients for Virtual Visits (Telemedicine).  Patients are able to view lab/test results, encounter notes, upcoming appointments, etc.  Non-urgent messages can be sent to your provider as well.   To learn more about what you can do with MyChart, go to [email or web address].    Your next appointment:   1 year(s)  The format for your next appointment:   In Person  Provider:   Cristopher Peru, MD{or one of the following Advanced Practice Providers on your designated Care Team:   Tommye Standard, [place] Legrand Como "Jonni Sanger" Chalmers Cater, [place]  Remote monitoring is used to monitor your Pacemaker from home. This monitoring reduces the number of office visits required to check your device to one time per year. It allows [place] to keep an eye on the functioning of your device to ensure it is working properly. You are scheduled  for a device check from home on 02/03/22. You may send your transmission at any time that day. If you have a wireless device, the transmission will be sent automatically. After your physician reviews your transmission, you will receive a postcard with your next transmission date.  Important Information About Sugar

## 2021-11-25 NOTE — Progress Notes (Signed)
HPI  Allergies  Allergen Reactions   Bee Venom Swelling  Anthony Huynh is referred today for evaluation and management of his DDD PM. 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 He feels well and denies chest pain or sob. He is active and has no limit. He had no trouble healing after his PPM insertion.     Current Outpatient Medications  Medication Sig Dispense Refill   allopurinol (ZYLOPRIM) 100 MG tablet Take 100 mg by mouth daily.     apixaban (ELIQUIS) 5 MG TABS tablet TAKE 1 TABLET BY MOUTH TWICE A DAY 60 tablet 5   B Complex Vitamins (B COMPLEX 1 PO) Take 1 tablet by mouth daily.     candesartan (ATACAND) 8 MG tablet 8 mg. 1/2 tab daily     Multiple Vitamin (MULTIVITAMIN ADULT) TABS Take 1 tablet by mouth daily.     Omega-3 Fatty Acids (FISH OIL) 1000 MG CAPS Take 1,000 mg by mouth daily.     Pitavastatin Calcium (LIVALO) 2 MG TABS Take 2 mg by mouth daily.     No current facility-administered medications for this visit.     History reviewed. No pertinent past medical history.  ROS:   All systems reviewed and negative except as noted in the HPI.   History reviewed. No pertinent surgical history.   History reviewed. No pertinent family history.   Social History   Socioeconomic History   Marital status: Married    Spouse name: Not on file   Number of children: Not on file   Years of education: Not on file   Highest education level: Not on file  Occupational History   Not on file  Tobacco Use   Smoking status: Never   Smokeless tobacco: Never  Vaping Use   Vaping Use: Never used  Substance and Sexual Activity   Alcohol use: Yes    Alcohol/week: 3.0 standard drinks of alcohol    Types: 1 Glasses of wine, 1 Cans of beer, 1 Standard drinks or equivalent per week    Comment: daily   Drug use: Never   Sexual activity: Yes  Other  Topics Concern   Not on file  Social History Narrative   Not on file   Social Determinants of Health   Financial Resource Strain: Not on file  Food Insecurity: Not on file  Transportation Needs: Not on file  Physical Activity: Not on file  Stress: Not on file  Social Connections: Not on file  Intimate Partner Violence: Not on file     BP 114/66   Pulse 61   Ht 6' (1.829 m)   Wt 217 lb 6.4 oz (98.6 kg)   SpO2 95%   BMI 29.48 kg/m   Physical Exam:  Well appearing NAD HEENT: Unremarkable Neck:  No JVD, no thyromegally Lymphatics:  No adenopathy Back:  No CVA tenderness Lungs:  Clear HEART:  Regular rate rhythm, no murmurs, no rubs, no clicks Abd:  soft, positive bowel sounds, no organomegally, no rebound, no guarding Ext:  2 plus pulses, no edema, no cyanosis, no clubbing Skin:  No rashes no nodules Neuro:  CN II through XII intact, motor grossly intact  EKG  DEVICE  Normal device function.  See PaceArt for details.   Assess/Plan  :Heart block - he is asymptomatic s/p PPM Insertion  PPM -his medtronic DDD PM is working normally. CAD - he has single vessel disease and denies anginal symptoms.  Persistent atrial fib - he has been in atrial fib for 2 months and has been asymptomatic. I have asked him to stop his rhythmol.   Anthony Overlie Antoni Stefan,MD

## 2021-11-27 DIAGNOSIS — J3489 Other specified disorders of nose and nasal sinuses: Secondary | ICD-10-CM | POA: Diagnosis not present

## 2021-11-27 DIAGNOSIS — I10 Essential (primary) hypertension: Secondary | ICD-10-CM | POA: Diagnosis not present

## 2021-11-27 DIAGNOSIS — D6869 Other thrombophilia: Secondary | ICD-10-CM | POA: Diagnosis not present

## 2021-11-27 DIAGNOSIS — Z1389 Encounter for screening for other disorder: Secondary | ICD-10-CM | POA: Diagnosis not present

## 2021-11-27 DIAGNOSIS — Z0000 Encounter for general adult medical examination without abnormal findings: Secondary | ICD-10-CM | POA: Diagnosis not present

## 2021-11-27 DIAGNOSIS — I480 Paroxysmal atrial fibrillation: Secondary | ICD-10-CM | POA: Diagnosis not present

## 2022-02-03 ENCOUNTER — Ambulatory Visit: Payer: Medicare Other | Attending: Internal Medicine

## 2022-05-05 ENCOUNTER — Ambulatory Visit (INDEPENDENT_AMBULATORY_CARE_PROVIDER_SITE_OTHER): Payer: Medicare Other

## 2022-05-05 DIAGNOSIS — I495 Sick sinus syndrome: Secondary | ICD-10-CM

## 2022-05-07 LAB — CUP PACEART REMOTE DEVICE CHECK
Battery Remaining Longevity: 151 mo
Battery Voltage: 3.04 V
Brady Statistic AP VP Percent: 69.83 %
Brady Statistic AP VS Percent: 0.02 %
Brady Statistic AS VP Percent: 29.83 %
Brady Statistic AS VS Percent: 0.3 %
Brady Statistic RA Percent Paced: 56.46 %
Brady Statistic RV Percent Paced: 99.59 %
Date Time Interrogation Session: 20240418001246
Implantable Lead Connection Status: 753985
Implantable Lead Connection Status: 753985
Implantable Lead Implant Date: 20221005
Implantable Lead Implant Date: 20221005
Implantable Lead Location: 753859
Implantable Lead Location: 753860
Implantable Lead Model: 3830
Implantable Lead Model: 4574
Implantable Pulse Generator Implant Date: 20221005
Lead Channel Impedance Value: 380 Ohm
Lead Channel Impedance Value: 399 Ohm
Lead Channel Impedance Value: 418 Ohm
Lead Channel Impedance Value: 551 Ohm
Lead Channel Pacing Threshold Amplitude: 0.375 V
Lead Channel Pacing Threshold Amplitude: 0.875 V
Lead Channel Pacing Threshold Pulse Width: 0.4 ms
Lead Channel Pacing Threshold Pulse Width: 0.4 ms
Lead Channel Sensing Intrinsic Amplitude: 14.625 mV
Lead Channel Sensing Intrinsic Amplitude: 14.625 mV
Lead Channel Sensing Intrinsic Amplitude: 3.875 mV
Lead Channel Sensing Intrinsic Amplitude: 3.875 mV
Lead Channel Setting Pacing Amplitude: 1.5 V
Lead Channel Setting Pacing Amplitude: 2 V
Lead Channel Setting Pacing Pulse Width: 0.4 ms
Lead Channel Setting Sensing Sensitivity: 0.9 mV
Zone Setting Status: 755011
Zone Setting Status: 755011

## 2022-06-08 NOTE — Progress Notes (Signed)
Remote pacemaker transmission.   

## 2022-06-09 IMAGING — CT CT HEAD W/O CM
3 series · 15 of 47 positions shown, 18 images · non-contrast
Comparison: None.

CLINICAL DATA: Trauma.

EXAM:
CT HEAD WITHOUT CONTRAST
CT CERVICAL SPINE WITHOUT CONTRAST
TECHNIQUE: Multidetector CT imaging of the head and cervical spine was
performed following the standard protocol without intravenous
contrast. Multiplanar CT image reconstructions of the cervical spine
were also generated.

[Series 3: head wo · axial · 0.47mm/px · z∈[-74,+71]mm · 9 of 35 slices shown, 12 images]
[im 3/35  brain]
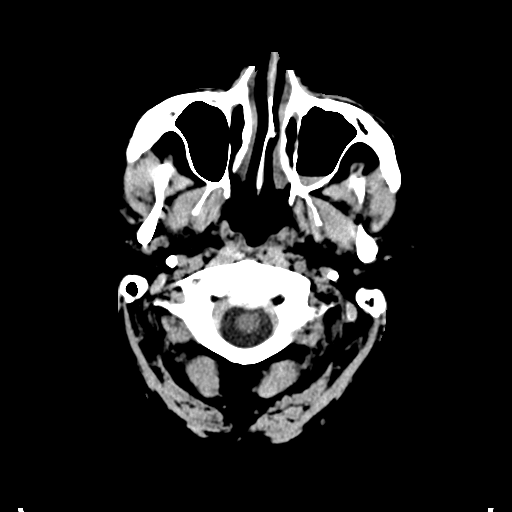
[im 3/35  bone]
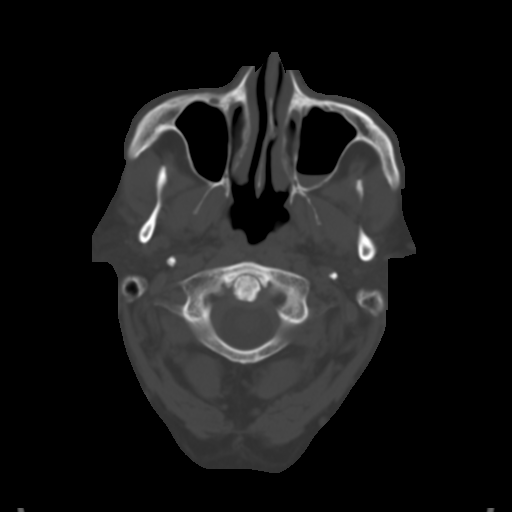
[im 6/35  brain]
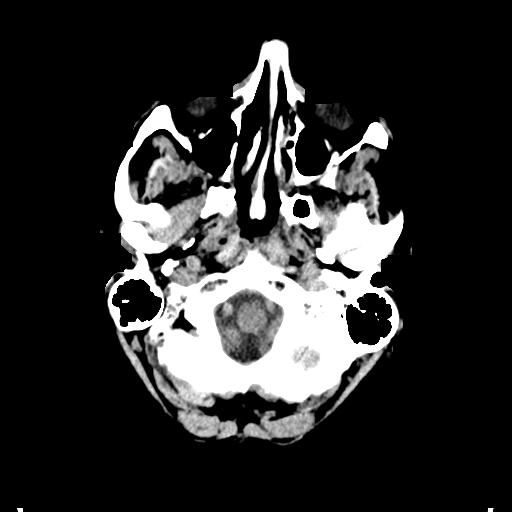
[im 10/35  brain]
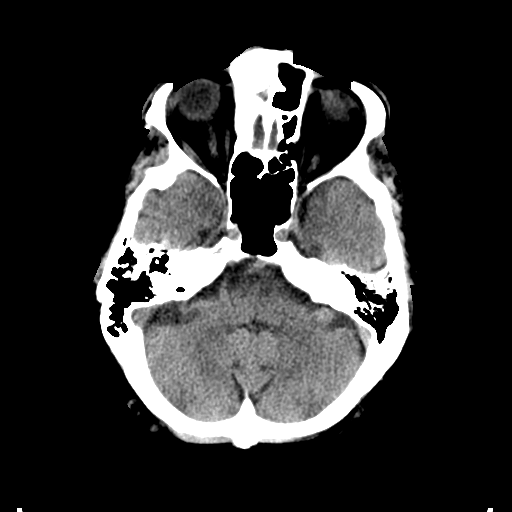
[im 13/35  brain]
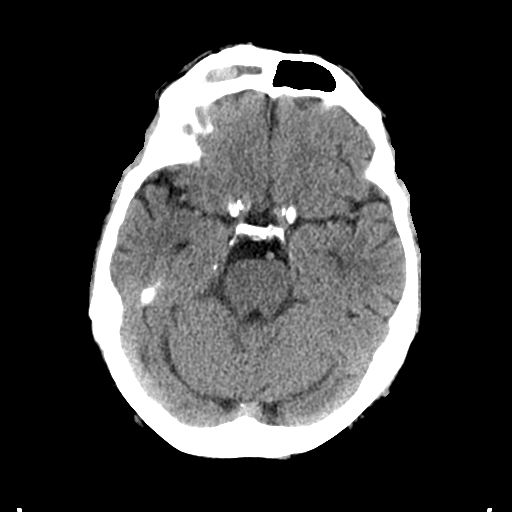
[im 18/35  brain]
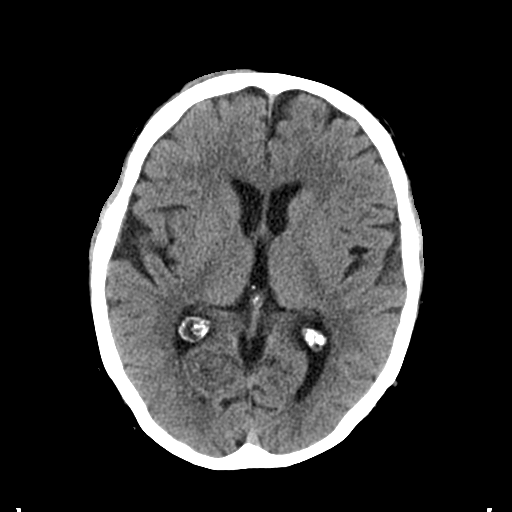
[im 18/35  bone]
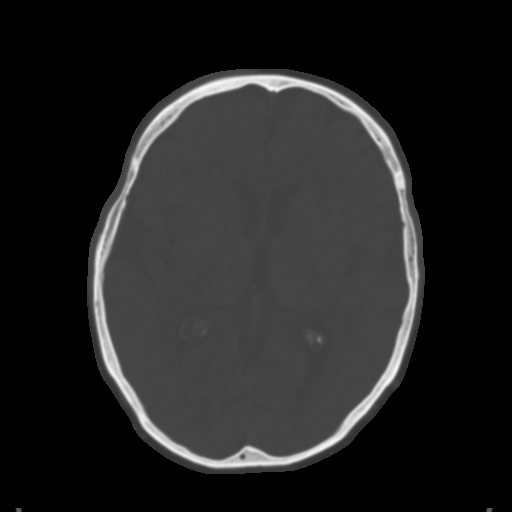
[im 22/35  brain]
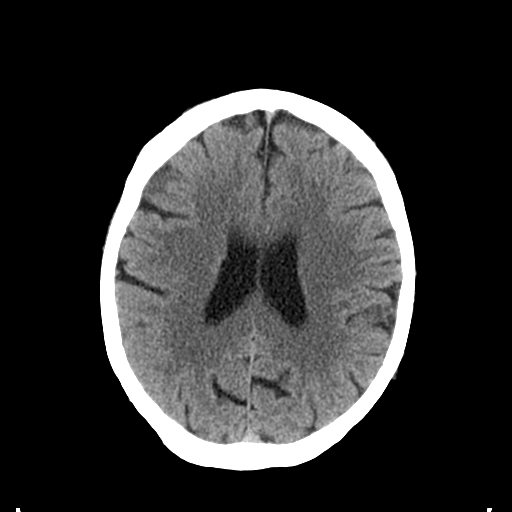
[im 25/35  brain]
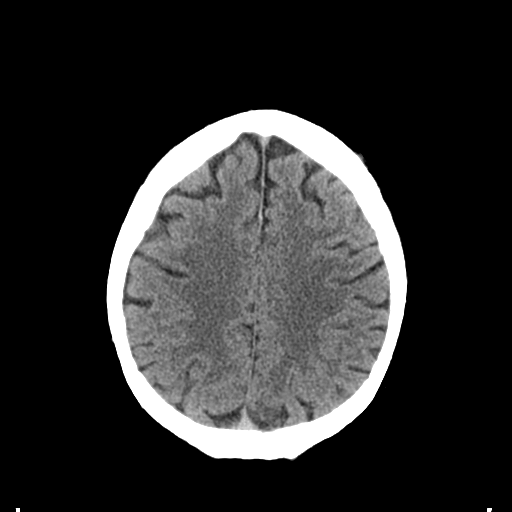
[im 29/35  brain]
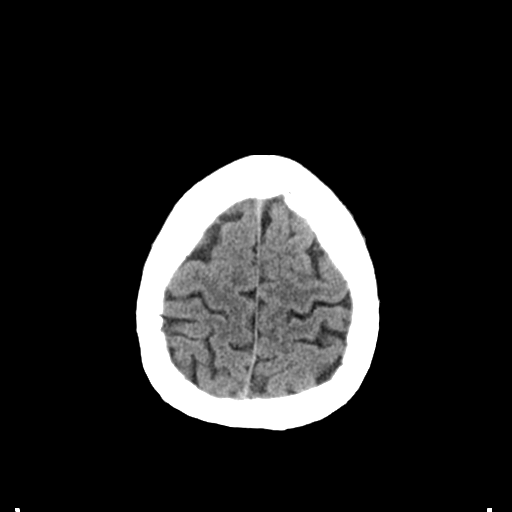
[im 32/35  brain]
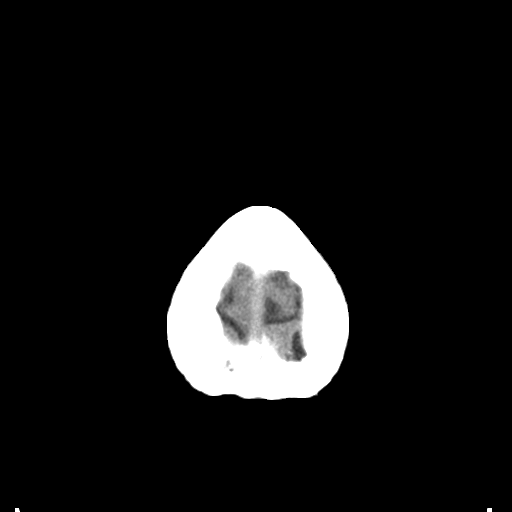
[im 32/35  bone]
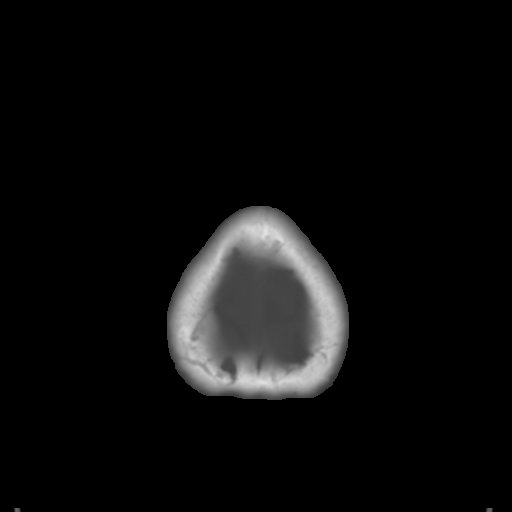

[Series 5: coronal soft tissue · coronal · 0.32mm/px · 3 of 68 slices shown]
[im 23/68  brain]
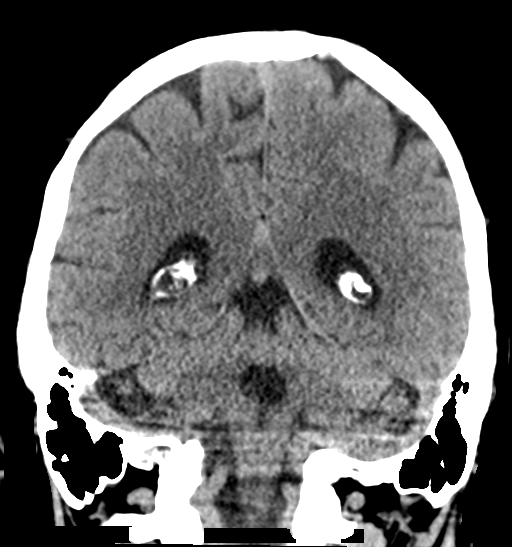
[im 30/68  brain]
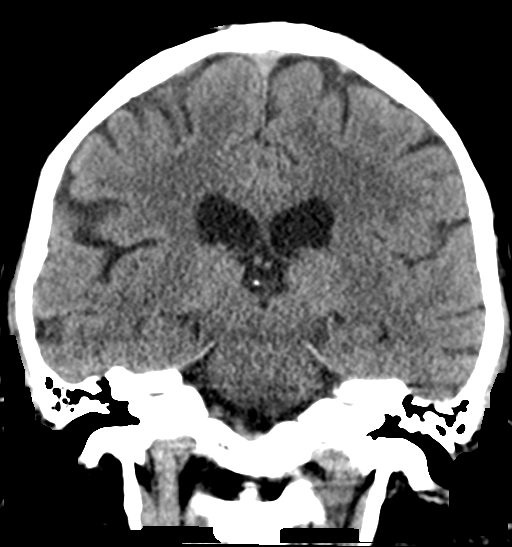
[im 38/68  brain]
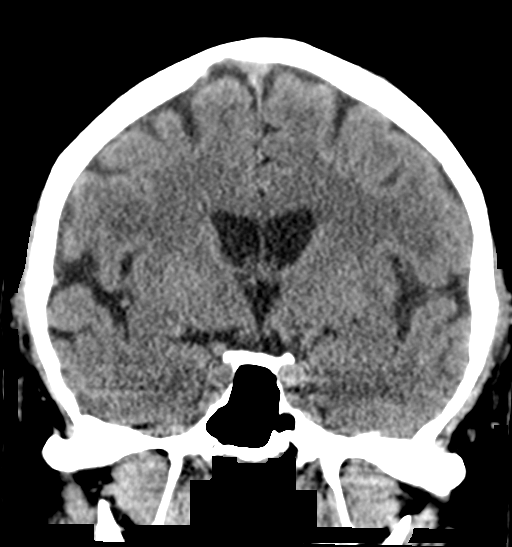

[Series 6: sagittal soft tissue · sagittal · 0.34mm/px · 3 of 57 slices shown]
[im 19/57  brain]
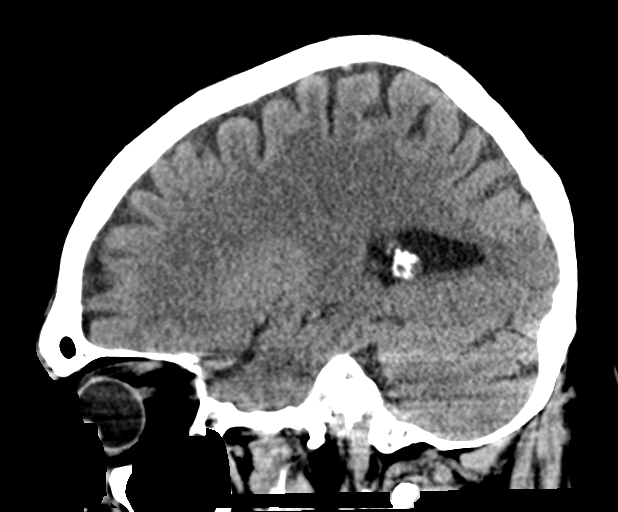
[im 29/57  brain]
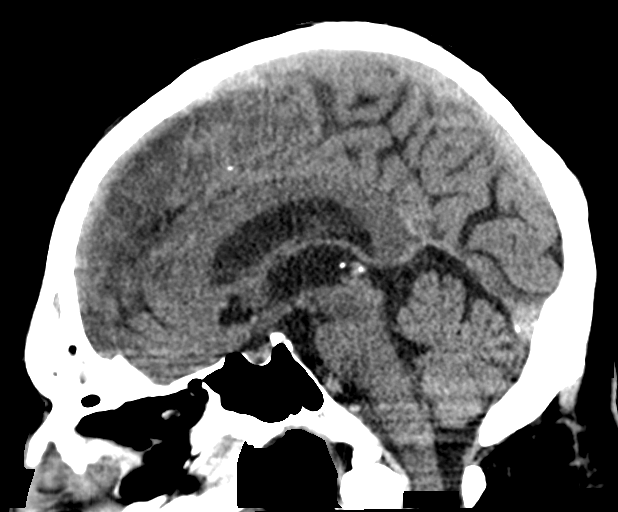
[im 38/57  brain]
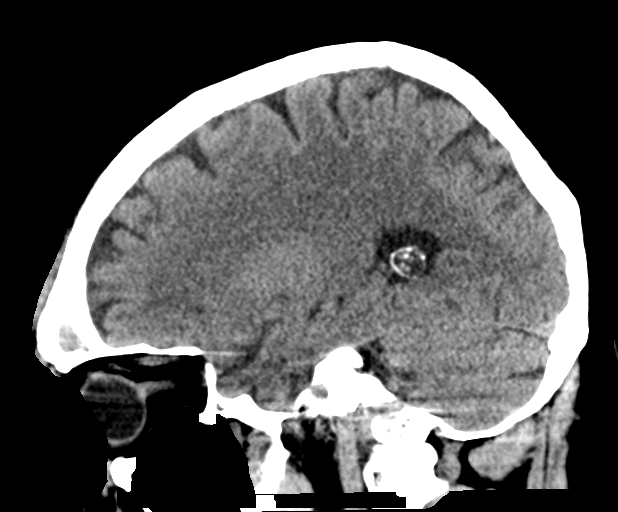

[15 of 47 positions shown; findings below may reference images not displayed]

FINDINGS: CT HEAD FINDINGS

Brain: The ventricles and sulci are appropriate size for the
patient's age. The gray-white matter discrimination is preserved.
There is no acute intracranial hemorrhage. No mass effect or midline
shift. No extra-axial fluid collection.

Vascular: No hyperdense vessel or unexpected calcification.

Skull: Normal. Negative for fracture or focal lesion.

Sinuses/Orbits: There is diffuse mucoperiosteal thickening of
paranasal sinuses and opacification of the right frontal sinus.
Small left maxillary sinus air-fluid level. The mastoid air cells
are clear.

Other: Mild right forehead contusion.

CT CERVICAL SPINE FINDINGS

Alignment: No acute subluxation.

Skull base and vertebrae: No acute fracture.  Osteopenia.

Soft tissues and spinal canal: No prevertebral fluid or swelling. No
visible canal hematoma.

Disc levels:  No acute findings.  Mild degenerative changes.

Upper chest: Negative.

Other: Bilateral carotid bulb calcified plaques. Partially
visualized pacemaker wires. A 2 cm left thyroid hypodense lesion. In
the setting of significant comorbidities or limited life expectancy,
no follow-up recommended (ref: [HOSPITAL]. [DATE]):
IMPRESSION: 1. No acute intracranial pathology.
2. No acute/traumatic cervical spine pathology.
3. Paranasal sinus disease.

## 2022-07-01 DIAGNOSIS — I7091 Generalized atherosclerosis: Secondary | ICD-10-CM | POA: Diagnosis not present

## 2022-07-01 DIAGNOSIS — K429 Umbilical hernia without obstruction or gangrene: Secondary | ICD-10-CM | POA: Diagnosis not present

## 2022-07-01 DIAGNOSIS — K869 Disease of pancreas, unspecified: Secondary | ICD-10-CM | POA: Diagnosis not present

## 2022-07-01 DIAGNOSIS — D6869 Other thrombophilia: Secondary | ICD-10-CM | POA: Diagnosis not present

## 2022-07-01 DIAGNOSIS — I10 Essential (primary) hypertension: Secondary | ICD-10-CM | POA: Diagnosis not present

## 2022-07-01 DIAGNOSIS — E785 Hyperlipidemia, unspecified: Secondary | ICD-10-CM | POA: Diagnosis not present

## 2022-07-01 DIAGNOSIS — I480 Paroxysmal atrial fibrillation: Secondary | ICD-10-CM | POA: Diagnosis not present

## 2022-07-01 DIAGNOSIS — R0981 Nasal congestion: Secondary | ICD-10-CM | POA: Diagnosis not present

## 2022-07-01 DIAGNOSIS — K4090 Unilateral inguinal hernia, without obstruction or gangrene, not specified as recurrent: Secondary | ICD-10-CM | POA: Diagnosis not present

## 2022-07-02 DIAGNOSIS — R350 Frequency of micturition: Secondary | ICD-10-CM | POA: Diagnosis not present

## 2022-08-04 ENCOUNTER — Ambulatory Visit (INDEPENDENT_AMBULATORY_CARE_PROVIDER_SITE_OTHER): Payer: Medicare Other

## 2022-08-04 DIAGNOSIS — I495 Sick sinus syndrome: Secondary | ICD-10-CM

## 2022-08-06 LAB — CUP PACEART REMOTE DEVICE CHECK
Battery Remaining Longevity: 147 mo
Battery Voltage: 3.03 V
Brady Statistic AP VP Percent: 72.63 %
Brady Statistic AP VS Percent: 0.01 %
Brady Statistic AS VP Percent: 27.21 %
Brady Statistic AS VS Percent: 0.15 %
Brady Statistic RA Percent Paced: 72.67 %
Brady Statistic RV Percent Paced: 99.84 %
Date Time Interrogation Session: 20240717190235
Implantable Lead Connection Status: 753985
Implantable Lead Connection Status: 753985
Implantable Lead Implant Date: 20221005
Implantable Lead Implant Date: 20221005
Implantable Lead Location: 753859
Implantable Lead Location: 753860
Implantable Lead Model: 3830
Implantable Lead Model: 4574
Implantable Pulse Generator Implant Date: 20221005
Lead Channel Impedance Value: 399 Ohm
Lead Channel Impedance Value: 399 Ohm
Lead Channel Impedance Value: 418 Ohm
Lead Channel Impedance Value: 570 Ohm
Lead Channel Pacing Threshold Amplitude: 0.375 V
Lead Channel Pacing Threshold Amplitude: 0.875 V
Lead Channel Pacing Threshold Pulse Width: 0.4 ms
Lead Channel Pacing Threshold Pulse Width: 0.4 ms
Lead Channel Sensing Intrinsic Amplitude: 14.625 mV
Lead Channel Sensing Intrinsic Amplitude: 15.5 mV
Lead Channel Sensing Intrinsic Amplitude: 5.625 mV
Lead Channel Sensing Intrinsic Amplitude: 5.625 mV
Lead Channel Setting Pacing Amplitude: 1.5 V
Lead Channel Setting Pacing Amplitude: 2 V
Lead Channel Setting Pacing Pulse Width: 0.4 ms
Lead Channel Setting Sensing Sensitivity: 0.9 mV
Zone Setting Status: 755011
Zone Setting Status: 755011

## 2022-08-18 DIAGNOSIS — K469 Unspecified abdominal hernia without obstruction or gangrene: Secondary | ICD-10-CM | POA: Diagnosis not present

## 2022-08-20 DIAGNOSIS — K469 Unspecified abdominal hernia without obstruction or gangrene: Secondary | ICD-10-CM | POA: Diagnosis not present

## 2022-08-20 NOTE — Progress Notes (Signed)
Remote pacemaker transmission.   

## 2022-10-25 ENCOUNTER — Encounter: Payer: Self-pay | Admitting: Internal Medicine

## 2022-11-18 ENCOUNTER — Ambulatory Visit (INDEPENDENT_AMBULATORY_CARE_PROVIDER_SITE_OTHER): Payer: Medicare Other

## 2022-11-18 DIAGNOSIS — I495 Sick sinus syndrome: Secondary | ICD-10-CM | POA: Diagnosis not present

## 2022-11-19 LAB — CUP PACEART REMOTE DEVICE CHECK
Battery Remaining Longevity: 142 mo
Battery Voltage: 3.03 V
Brady Statistic AP VP Percent: 73.24 %
Brady Statistic AP VS Percent: 0.01 %
Brady Statistic AS VP Percent: 26.65 %
Brady Statistic AS VS Percent: 0.1 %
Brady Statistic RA Percent Paced: 73.28 %
Brady Statistic RV Percent Paced: 99.89 %
Date Time Interrogation Session: 20241031050025
Implantable Lead Connection Status: 753985
Implantable Lead Connection Status: 753985
Implantable Lead Implant Date: 20221005
Implantable Lead Implant Date: 20221005
Implantable Lead Location: 753859
Implantable Lead Location: 753860
Implantable Lead Model: 3830
Implantable Lead Model: 4574
Implantable Pulse Generator Implant Date: 20221005
Lead Channel Impedance Value: 380 Ohm
Lead Channel Impedance Value: 399 Ohm
Lead Channel Impedance Value: 418 Ohm
Lead Channel Impedance Value: 551 Ohm
Lead Channel Pacing Threshold Amplitude: 0.375 V
Lead Channel Pacing Threshold Amplitude: 0.75 V
Lead Channel Pacing Threshold Pulse Width: 0.4 ms
Lead Channel Pacing Threshold Pulse Width: 0.4 ms
Lead Channel Sensing Intrinsic Amplitude: 18.25 mV
Lead Channel Sensing Intrinsic Amplitude: 31.625 mV
Lead Channel Sensing Intrinsic Amplitude: 4.125 mV
Lead Channel Sensing Intrinsic Amplitude: 4.125 mV
Lead Channel Setting Pacing Amplitude: 1.5 V
Lead Channel Setting Pacing Amplitude: 2 V
Lead Channel Setting Pacing Pulse Width: 0.4 ms
Lead Channel Setting Sensing Sensitivity: 0.9 mV
Zone Setting Status: 755011
Zone Setting Status: 755011

## 2022-12-08 NOTE — Progress Notes (Signed)
Remote pacemaker transmission.   

## 2022-12-11 ENCOUNTER — Encounter: Payer: Medicare Other | Admitting: Internal Medicine

## 2022-12-24 DIAGNOSIS — I10 Essential (primary) hypertension: Secondary | ICD-10-CM | POA: Diagnosis not present

## 2022-12-24 DIAGNOSIS — I7091 Generalized atherosclerosis: Secondary | ICD-10-CM | POA: Diagnosis not present

## 2022-12-24 DIAGNOSIS — Z950 Presence of cardiac pacemaker: Secondary | ICD-10-CM | POA: Diagnosis not present

## 2022-12-24 DIAGNOSIS — D6869 Other thrombophilia: Secondary | ICD-10-CM | POA: Diagnosis not present

## 2022-12-24 DIAGNOSIS — Z0000 Encounter for general adult medical examination without abnormal findings: Secondary | ICD-10-CM | POA: Diagnosis not present

## 2022-12-24 DIAGNOSIS — I480 Paroxysmal atrial fibrillation: Secondary | ICD-10-CM | POA: Diagnosis not present

## 2022-12-24 DIAGNOSIS — E785 Hyperlipidemia, unspecified: Secondary | ICD-10-CM | POA: Diagnosis not present

## 2022-12-24 DIAGNOSIS — Z1389 Encounter for screening for other disorder: Secondary | ICD-10-CM | POA: Diagnosis not present

## 2023-02-17 ENCOUNTER — Ambulatory Visit (INDEPENDENT_AMBULATORY_CARE_PROVIDER_SITE_OTHER): Payer: Medicare Other

## 2023-02-17 DIAGNOSIS — I495 Sick sinus syndrome: Secondary | ICD-10-CM | POA: Diagnosis not present

## 2023-03-29 NOTE — Progress Notes (Signed)
 Remote pacemaker transmission.

## 2023-06-24 DIAGNOSIS — J0110 Acute frontal sinusitis, unspecified: Secondary | ICD-10-CM | POA: Diagnosis not present

## 2023-06-28 ENCOUNTER — Ambulatory Visit

## 2023-06-28 DIAGNOSIS — I495 Sick sinus syndrome: Secondary | ICD-10-CM | POA: Diagnosis not present

## 2023-06-29 LAB — CUP PACEART REMOTE DEVICE CHECK
Battery Remaining Longevity: 136 mo
Battery Voltage: 3.02 V
Brady Statistic AP VP Percent: 68.64 %
Brady Statistic AP VS Percent: 0.01 %
Brady Statistic AS VP Percent: 31.12 %
Brady Statistic AS VS Percent: 0.23 %
Brady Statistic RA Percent Paced: 68.64 %
Brady Statistic RV Percent Paced: 99.76 %
Date Time Interrogation Session: 20250607151925
Implantable Lead Connection Status: 753985
Implantable Lead Connection Status: 753985
Implantable Lead Implant Date: 20221005
Implantable Lead Implant Date: 20221005
Implantable Lead Location: 753859
Implantable Lead Location: 753860
Implantable Lead Model: 3830
Implantable Lead Model: 4574
Implantable Pulse Generator Implant Date: 20221005
Lead Channel Impedance Value: 437 Ohm
Lead Channel Impedance Value: 456 Ohm
Lead Channel Impedance Value: 456 Ohm
Lead Channel Impedance Value: 608 Ohm
Lead Channel Pacing Threshold Amplitude: 0.375 V
Lead Channel Pacing Threshold Amplitude: 0.625 V
Lead Channel Pacing Threshold Pulse Width: 0.4 ms
Lead Channel Pacing Threshold Pulse Width: 0.4 ms
Lead Channel Sensing Intrinsic Amplitude: 21.125 mV
Lead Channel Sensing Intrinsic Amplitude: 21.125 mV
Lead Channel Sensing Intrinsic Amplitude: 6.125 mV
Lead Channel Sensing Intrinsic Amplitude: 6.125 mV
Lead Channel Setting Pacing Amplitude: 1.5 V
Lead Channel Setting Pacing Amplitude: 2 V
Lead Channel Setting Pacing Pulse Width: 0.4 ms
Lead Channel Setting Sensing Sensitivity: 0.9 mV
Zone Setting Status: 755011
Zone Setting Status: 755011

## 2023-06-30 NOTE — Progress Notes (Signed)
  Electrophysiology Office Note:   ID:  Anthony Huynh., DOB August 21, 1945, MRN [id number]  Primary Cardiologist: None Electrophysiologist: Manya Sells, MD      History of Present Illness:   Anthony Huynh. is a 78 y.o. male with h/o longstanding persistent AF, heart block s/p PPM, and CAD seen today for routine electrophysiology followup.   Since last being seen in our clinic the patient reports doing OK. Spends half the year in [place]. States that he was started on Cardol (Sotalol) there and is seen every 3 months. Otherwise, he denies chest pain, palpitations, dyspnea, PND, orthopnea, nausea, vomiting, dizziness, syncope, edema, weight gain, or early satiety.   Review of systems complete and found to be negative unless listed in HPI.   EP Information / Studies Reviewed:    EKG is ordered today. Personal review as below.  EKG Interpretation Date/Time:  Thursday July 01 2023 11:12:39 EDT Ventricular Rate:  66 PR Interval:  206 QRS Duration:  96 QT Interval:  430 QTC Calculation: 450 R Axis:   150  Text Interpretation: Atrial-paced rhythm Incomplete right bundle branch block Anterolateral infarct , age undetermined ST & T wave abnormality, consider inferior ischemia When compared with ECG of 14-Jan-2021 01:34, Electronic atrial pacemaker has replaced Electronic ventricular pacemaker Confirmed by Pilar Bridge [phone]) on 07/01/2023 11:16:46 AM    PPM Interrogation-  reviewed in detail today,  See PACEART report.  Arrhythmia/Device History Medtronic dual chamber PPM implanted 10/2020 for heart block     Physical Exam:   VS:  BP 113/74   Pulse 66   Ht 6' (1.829 m)   Wt 224 lb (101.6 kg)   SpO2 96%   BMI 30.38 kg/m    Wt Readings from Last 3 Encounters:  07/01/23 224 lb (101.6 kg)  11/25/21 217 lb 6.4 oz (98.6 kg)  01/14/21 224 lb 13.9 oz (102 kg)     GEN: No acute distress  NECK: No JVD; No carotid bruits CARDIAC: Regular rate and rhythm, no murmurs, rubs,  gallops RESPIRATORY:  Clear to auscultation without rales, wheezing or rhonchi  ABDOMEN: Soft, non-tender, non-distended EXTREMITIES:  No edema; No deformity   ASSESSMENT AND PLAN:    Heart Block s/p Medtronic PPM  Normal PPM function See Pace Art report No changes today  Persistent AF Continue eliquis  5 mg BID for CHA2DS2/VASc of at least 4 He states he was started on Sotalol, but is unsure what dose. Will get BMET and Mg today. EKG looks OK. He will confirm dose. Reviewed risk of QT prolongation when combined with some medications and need for monitoring with Sotalol.  Secondary hypercoagulable state Pt on Eliquis  as above    HTN Stable on current regimen   Disposition:   Follow up with EP Team in 6 months if he is on Sotalol.  If not, can see annually.  He is to call back and confirm dose/med.   Signed, Tylene Galla, PA-C

## 2023-07-01 ENCOUNTER — Ambulatory Visit: Attending: Student | Admitting: Student

## 2023-07-01 ENCOUNTER — Encounter: Payer: Self-pay | Admitting: Student

## 2023-07-01 VITALS — BP 113/74 | HR 66 | Ht 72.0 in | Wt 224.0 lb

## 2023-07-01 DIAGNOSIS — I442 Atrioventricular block, complete: Secondary | ICD-10-CM

## 2023-07-01 DIAGNOSIS — D6869 Other thrombophilia: Secondary | ICD-10-CM

## 2023-07-01 DIAGNOSIS — I10 Essential (primary) hypertension: Secondary | ICD-10-CM | POA: Diagnosis not present

## 2023-07-01 DIAGNOSIS — R059 Cough, unspecified: Secondary | ICD-10-CM | POA: Diagnosis not present

## 2023-07-01 DIAGNOSIS — I4811 Longstanding persistent atrial fibrillation: Secondary | ICD-10-CM

## 2023-07-01 DIAGNOSIS — R0981 Nasal congestion: Secondary | ICD-10-CM | POA: Diagnosis not present

## 2023-07-01 LAB — CUP PACEART INCLINIC DEVICE CHECK
Battery Remaining Longevity: 135 mo
Battery Voltage: 3.02 V
Brady Statistic AP VP Percent: 68.6 %
Brady Statistic AP VS Percent: 0 %
Brady Statistic AS VP Percent: 31.16 %
Brady Statistic AS VS Percent: 0.24 %
Brady Statistic RA Percent Paced: 68.6 %
Brady Statistic RV Percent Paced: 99.76 %
Date Time Interrogation Session: 20250612125054
Implantable Lead Connection Status: 753985
Implantable Lead Connection Status: 753985
Implantable Lead Implant Date: 20221005
Implantable Lead Implant Date: 20221005
Implantable Lead Location: 753859
Implantable Lead Location: 753860
Implantable Lead Model: 3830
Implantable Lead Model: 4574
Implantable Pulse Generator Implant Date: 20221005
Lead Channel Impedance Value: 399 Ohm
Lead Channel Impedance Value: 437 Ohm
Lead Channel Impedance Value: 437 Ohm
Lead Channel Impedance Value: 589 Ohm
Lead Channel Pacing Threshold Amplitude: 0.375 V
Lead Channel Pacing Threshold Amplitude: 0.625 V
Lead Channel Pacing Threshold Pulse Width: 0.4 ms
Lead Channel Pacing Threshold Pulse Width: 0.4 ms
Lead Channel Sensing Intrinsic Amplitude: 18.5 mV
Lead Channel Sensing Intrinsic Amplitude: 20.5 mV
Lead Channel Sensing Intrinsic Amplitude: 5.125 mV
Lead Channel Sensing Intrinsic Amplitude: 5.125 mV
Lead Channel Setting Pacing Amplitude: 1.5 V
Lead Channel Setting Pacing Amplitude: 2 V
Lead Channel Setting Pacing Pulse Width: 0.4 ms
Lead Channel Setting Sensing Sensitivity: 0.9 mV
Zone Setting Status: 755011
Zone Setting Status: 755011

## 2023-07-01 NOTE — Patient Instructions (Signed)
 Medication Instructions:  No medication changes today. *If you need a refill on your cardiac medications before your next appointment, please call your pharmacy*  Lab Work: BMET and Mg today. If you have labs (blood work) drawn today and your tests are completely normal, you will receive your results only by: MyChart Message (if you have MyChart) OR A paper copy in the mail If you have any lab test that is abnormal or we need to change your treatment, we will call you to review the results.  Testing/Procedures: No testing ordered today  Follow-Up: At [facility], you and your health needs are our priority.  As part of our continuing mission to provide you with exceptional heart care, our providers are all part of one team.  This team includes your primary Cardiologist (physician) and Advanced Practice Providers or APPs (Physician Assistants and Nurse Practitioners) who all work together to provide you with the care you need, when you need it.  Your next appointment:   6 month(s)  Provider:   You may see Manya Sells, MD or one of the following Advanced Practice Providers on your designated Care Team:   Mertha Abrahams, PA-C Wrigley Winborne Andy Freddi Forster, PA-C Suzann Riddle, NP Creighton Doffing, NP    We recommend signing up for the patient portal called MyChart.  Sign up information is provided on this After Visit Summary.  MyChart is used to connect with patients for Virtual Visits (Telemedicine).  Patients are able to view lab/test results, encounter notes, upcoming appointments, etc.  Non-urgent messages can be sent to your provider as well.   To learn more about what you can do with MyChart, go to [email or web address].

## 2023-07-02 ENCOUNTER — Ambulatory Visit: Payer: Self-pay | Admitting: Student

## 2023-07-02 LAB — BASIC METABOLIC PANEL WITH GFR
BUN/Creatinine Ratio: 16 (ref 10–24)
BUN: 14 mg/dL (ref 8–27)
CO2: 22 mmol/L (ref 20–29)
Calcium: 8.8 mg/dL (ref 8.6–10.2)
Chloride: 100 mmol/L (ref 96–106)
Creatinine, Ser: 0.9 mg/dL (ref 0.76–1.27)
Glucose: 91 mg/dL (ref 70–99)
Potassium: 4.3 mmol/L (ref 3.5–5.2)
Sodium: 138 mmol/L (ref 134–144)
eGFR: 87 mL/min/{1.73_m2} (ref >59)

## 2023-07-02 LAB — MAGNESIUM: Magnesium: 2.1 mg/dL (ref 1.6–2.3)

## 2023-07-04 ENCOUNTER — Ambulatory Visit: Payer: Self-pay | Admitting: Internal Medicine

## 2023-07-13 DIAGNOSIS — J208 Acute bronchitis due to other specified organisms: Secondary | ICD-10-CM | POA: Diagnosis not present

## 2023-08-02 ENCOUNTER — Telehealth: Payer: Self-pay

## 2023-08-02 NOTE — Telephone Encounter (Signed)
 Call to patient to discuss concerns.   Patient states he started feeling fatigued, weak and lightheaded 3-4 days ago and began to check his BP daily. He noticed his systolic number was 80-90, where his BP is normally 125  systolic. His HR is 60 (he has a pacemaker). While speaking with me, patient was driving and on his way to pick up lunch, he denied lightheadedness at the time of the call. He also denies any shortness of breath or chest pain. His last OV was with A. Tillery on 07/01/23 and his BP at that time was 113/74.   On review of his medications, he states his doctor in [place] changed his candesartan dose from 4 mg to 8 mg about 2 months ago. He also reports that he has been golfing outside in the heat the last few days and has been sweating a lot. Patient has a pacemaker and offers to send a transmission once he gets home. He also reports that he only took 4 mg of candesartan today because he was concerned about his BP numbers and his symptoms.  Advised patient not to take his candesartan again until he hears from our office, and to ensure he gets adequate water intake. Made pt an appt with R. Leverne 08/25/23 at patient's request. Also reviewed syncope precautions, patient verbalizes understanding to go ER or call 911 if he experiences syncope or near-syncope. Advised patient not to drive if he feels lightheaded or if SBP is 80 or lower.

## 2023-08-02 NOTE — Telephone Encounter (Signed)
 The pt wanted to make an appointment because his blood pressure is running low. I told him someone will give him a call back.

## 2023-08-02 NOTE — Telephone Encounter (Signed)
 Remote transmission received.    Device function WNL.  No significant episodes noted.  Returned call to Pt.  Provided education regarding replacement of fluids and electrolytes during this time of season.  Pt states he has been drinking water but avoids salt.  Advised when it is hot and he is outside playing golf and sweating he needs to have a little salt.  Advised pedialyte or liquid IV.  Advised Pt to continue his candesartan 8 mg 1/2 tablet by mouth daily (this was his prior dose) and continue to monitor his BP's.  Advised to contact office if BP's continue to run low and he is symptomatic.  Pt indicates understanding and thanked nurse for call back.

## 2023-08-12 NOTE — Progress Notes (Signed)
 Remote pacemaker transmission.

## 2023-08-12 NOTE — Addendum Note (Signed)
 Addended by: TAWNI DRILLING D on: 08/12/2023 10:14 AM   Modules accepted: Orders

## 2023-08-23 NOTE — Progress Notes (Unsigned)
  Cardiology Office Note:  .   Date:  08/23/2023  ID:  Elsie [place] Raddle., DOB [birth year], MRN [id number] PCP: Ransom Other, MD  [facility] Providers Cardiologist:  None Electrophysiologist:  Danelle Birmingham, MD {  History of Present Illness: .   Charvez Voorhies. is a 78 y.o. male w/PMHx of  CAD (unclear, older notes report single vessel disease), HTN CHB w/PPM AFib  Last saw Dr. Birmingham 2023  Lives part time here and then in [place]  More recently saw Jodie 07/01/23, reported that while in [place], was started on Cardol (Sotalol) there and is seen every 3 months. Doing well, QTc looked OK Sotalol teaching done, planned for labs  Called with concerns of low BPs, remote transmission looked OK advised adequate hydration   Today's visit is scheduled to f/u on low BPs ROS:   *** BP? *** sotalol, EKG, labs, meds *** eliquis , dose, bleeding, labs  Device information MDT dual chambe PPM implanted 10/23/20  Arrhythmia/AAD hx Afib Sotalol ( started in [place])  Studies Reviewed: SABRA    EKG done today and reviewed by myself ***  DEVICE interrogation done in clinic 07/01/23 Battery and lead measurements are good No AF 4 NSVTs   Risk Assessment/Calculations:    Physical Exam:   VS:  There were no vitals taken for this visit.   Wt Readings from Last 3 Encounters:  07/01/23 224 lb (101.6 kg)  11/25/21 217 lb 6.4 oz (98.6 kg)  01/14/21 224 lb 13.9 oz (102 kg)    GEN: Well nourished, well developed in no acute distress NECK: No JVD; No carotid bruits CARDIAC: ***RRR, no murmurs, rubs, gallops RESPIRATORY:  *** CTA b/l without rales, wheezing or rhonchi  ABDOMEN: Soft, non-tender, non-distended EXTREMITIES:  No edema; No deformity   PPM site: *** is stable, no thinning, fluctuation, tethering  ASSESSMENT AND PLAN: .    paroxysmal AFib CHA2DS2Vasc is 4, on Eliquis , *** appropriately dosed *** % burden  PPM Stable in clinic and remote  recently  HTN ***  Secondary hypercoagulable state 2/2 AFib     {Are you ordering a CV Procedure (e.g. stress test, cath, DCCV, TEE, etc)?   Press F2        :[id number]}     Dispo: ***  Signed, Charlies Macario Arthur, PA-C

## 2023-08-25 ENCOUNTER — Ambulatory Visit: Attending: Cardiology | Admitting: Physician Assistant

## 2023-08-25 ENCOUNTER — Encounter: Payer: Self-pay | Admitting: Physician Assistant

## 2023-08-25 VITALS — BP 98/64 | HR 64 | Ht 72.0 in | Wt 224.0 lb

## 2023-08-25 DIAGNOSIS — I480 Paroxysmal atrial fibrillation: Secondary | ICD-10-CM

## 2023-08-25 DIAGNOSIS — D6869 Other thrombophilia: Secondary | ICD-10-CM

## 2023-08-25 DIAGNOSIS — Z79899 Other long term (current) drug therapy: Secondary | ICD-10-CM

## 2023-08-25 DIAGNOSIS — I442 Atrioventricular block, complete: Secondary | ICD-10-CM

## 2023-08-25 DIAGNOSIS — Z950 Presence of cardiac pacemaker: Secondary | ICD-10-CM | POA: Diagnosis not present

## 2023-08-25 DIAGNOSIS — Z5181 Encounter for therapeutic drug level monitoring: Secondary | ICD-10-CM

## 2023-08-25 DIAGNOSIS — I10 Essential (primary) hypertension: Secondary | ICD-10-CM | POA: Diagnosis not present

## 2023-08-25 NOTE — Patient Instructions (Signed)
 Medication Instructions:   Your physician recommends that you continue on your current medications as directed. Please refer to the Current Medication list given to you today.  *If you need a refill on your cardiac medications before your next appointment, please call your pharmacy*  Lab Work: NONE ORDERED  TODAY    If you have labs (blood work) drawn today and your tests are completely normal, you will receive your results only by: MyChart Message (if you have MyChart) OR A paper copy in the mail If you have any lab test that is abnormal or we need to change your treatment, we will call you to review the results.  Testing/Procedures: NONE ORDERED  TODAY    Follow-Up: At [facility], you and your health needs are our priority.  As part of our continuing mission to provide you with exceptional heart care, our providers are all part of one team.  This team includes your primary Cardiologist (physician) and Advanced Practice Providers or APPs (Physician Assistants and Nurse Practitioners) who all work together to provide you with the care you need, when you need it.  Your next appointment:   4 month(s) ( CONTACT  CASSIE HALL/ ANGELINE HAMMER FOR EP SCHEDULING ISSUES )   Provider:   You may see Danelle Birmingham, MD or one of the following Advanced Practice Providers on your designated Care Team:   Charlies Arthur, [place]  We recommend signing up for the patient portal called MyChart.  Sign up information is provided on this After Visit Summary.  MyChart is used to connect with patients for Virtual Visits (Telemedicine).  Patients are able to view lab/test results, encounter notes, upcoming appointments, etc.  Non-urgent messages can be sent to your provider as well.   To learn more about what you can do with MyChart, go to [email or web address].   Other Instructions

## 2023-09-27 ENCOUNTER — Encounter

## 2023-11-08 LAB — LAB REPORT - SCANNED
A1c: 5.7
EGFR: 81.3
Free T4: 1.06 ng/dL
TSH: 3.13 (ref 0.41–5.90)

## 2023-12-06 ENCOUNTER — Ambulatory Visit: Attending: Internal Medicine

## 2023-12-06 DIAGNOSIS — I480 Paroxysmal atrial fibrillation: Secondary | ICD-10-CM | POA: Diagnosis not present

## 2023-12-06 LAB — CUP PACEART REMOTE DEVICE CHECK
Battery Remaining Longevity: 126 mo
Battery Voltage: 3.02 V
Brady Statistic AP VP Percent: 73.32 %
Brady Statistic AP VS Percent: 0.04 %
Brady Statistic AS VP Percent: 26.23 %
Brady Statistic AS VS Percent: 0.42 %
Brady Statistic RA Percent Paced: 73.49 %
Brady Statistic RV Percent Paced: 99.55 %
Date Time Interrogation Session: 20251115211130
Implantable Lead Connection Status: 753985
Implantable Lead Connection Status: 753985
Implantable Lead Implant Date: 20221005
Implantable Lead Implant Date: 20221005
Implantable Lead Location: 753859
Implantable Lead Location: 753860
Implantable Lead Model: 3830
Implantable Lead Model: 4574
Implantable Pulse Generator Implant Date: 20221005
Lead Channel Impedance Value: 361 Ohm
Lead Channel Impedance Value: 399 Ohm
Lead Channel Impedance Value: 399 Ohm
Lead Channel Impedance Value: 532 Ohm
Lead Channel Pacing Threshold Amplitude: 0.375 V
Lead Channel Pacing Threshold Amplitude: 0.75 V
Lead Channel Pacing Threshold Pulse Width: 0.4 ms
Lead Channel Pacing Threshold Pulse Width: 0.4 ms
Lead Channel Sensing Intrinsic Amplitude: 3.75 mV
Lead Channel Sensing Intrinsic Amplitude: 3.75 mV
Lead Channel Sensing Intrinsic Amplitude: 31.625 mV
Lead Channel Sensing Intrinsic Amplitude: 31.625 mV
Lead Channel Setting Pacing Amplitude: 1.5 V
Lead Channel Setting Pacing Amplitude: 2 V
Lead Channel Setting Pacing Pulse Width: 0.4 ms
Lead Channel Setting Sensing Sensitivity: 0.9 mV
Zone Setting Status: 755011
Zone Setting Status: 755011

## 2023-12-08 ENCOUNTER — Ambulatory Visit: Payer: Self-pay | Admitting: Internal Medicine

## 2023-12-08 NOTE — Progress Notes (Signed)
 Remote PPM Transmission

## 2023-12-27 ENCOUNTER — Encounter

## 2023-12-27 NOTE — Progress Notes (Unsigned)
 Cardiology Office Note:  .   Date:  12/27/2023  ID:  Anthony [place] Raddle., DOB 04-Dec-1945, MRN [id number] PCP: Ransom Other, MD  [facility] Providers Cardiologist:  None Electrophysiologist:  Danelle Birmingham, MD {  History of Present Illness: .   Anthony Dennard. is a 78 y.o. male w/PMHx of  CAD (unclear, older notes report single vessel disease), HTN CHB w/PPM AFib  Last saw Dr. Birmingham 2023  Lives part time here and then in [place]  More recently saw Jodie 07/01/23, reported that while in [place], was started on Cardol (Sotalol) there and is seen every 3 months. Doing well, QTc looked OK Sotalol teaching done, planned for labs  Called a few weeks ago with concerns of low BPs, remote transmission looked OK advised adequate hydration   I saw him 08/25/23 He says that ~ June noted that he was feeling weak, lightheaded and finding his BP getting towards the 80's systolic. His PMD had at their last visit increased his candesartan dose with BPs 140's Though he mentioned had been rushing that day. Typically home numbers would be 110's  He went ahead and self reduced his candesartan back to 1/2 tab daily and reached out to us  > though also feeling better on the lowered dose.  remote was sent in and found OK. He recalls had been out golfing, yard work, active and as profusely sweating those weeks and suspects he got a bit dehydrated and actively started to hydrated better as well. Since these > feeling well again  Dose of sotalol was unclear, he was asked to clarify once home  Today's visit is scheduled as 4 mo visit ROS:   Spend most of his time in [place] Here for a few more weeks, will be back again in June He feels well Denies CP, palpitations or cardiac awareness No SOB, denies DOE No near syncope or syncope. No bleeding or signs of bleeding  He brings a health record with him LABS dayed 11/08/23 H?H 13/38 Plts 158 BUN/Creat 11/0.95  Device information MDT dual  chamber PPM implanted 10/23/20  Arrhythmia/AAD hx Afib Sotalol (started in [place], ~ June 2025)  Studies Reviewed: SABRA    EKG done today and reviewed by myself AV paced 60bpm, QTc  08/25/23: AV paced, QTc  Limited DEVICE interrogation done to evaluate AFib burden, reviewed by myself Battery and lead measurements are good Zero% AFib burden One NSVT  Observed intermittently to have PVCs Burden by the device  only 19/hour OptiVol suggests volume OL   Risk Assessment/Calculations:    Physical Exam:   VS:  There were no vitals taken for this visit.   Wt Readings from Last 3 Encounters:  08/25/23 224 lb (101.6 kg)  07/01/23 224 lb (101.6 kg)  11/25/21 217 lb 6.4 oz (98.6 kg)    GEN: Well nourished, well developed in no acute distress NECK: No JVD; No carotid bruits CARDIAC: RRR, no murmurs, rubs, gallops RESPIRATORY: CTA b/l without rales, wheezing or rhonchi  ABDOMEN: Soft, non-tender, non-distended EXTREMITIES:   No edema; No deformity   PPM site: is stable, no thinning, fluctuation, tethering  ASSESSMENT AND PLAN: .    paroxysmal AFib CHA2DS2Vasc is 4, on Eliquis , appropriately dosed zero % burden Sotalol w/stable QTc  PPM intact function no programming changes made  HTN Looks good Continue with his PMD  Secondary hypercoagulable state 2/2 AFib  5. OptiVol is up No symptoms or exam findings to support volum OL  6. PVCs There  are briefly/intermittently frequent while here, none on his EKG and periods of none while on the programmer Will get labs/lytes He reports an ache done in [place] a couple years ago that was OK May update whle here if labs/lytes are wnl    Dispo: back in June, he also has care in [place], sooner if needed  Signed, Charlies Macario Arthur, PA-C

## 2023-12-28 ENCOUNTER — Ambulatory Visit: Attending: Physician Assistant | Admitting: Physician Assistant

## 2023-12-28 VITALS — BP 134/76 | Ht 72.0 in | Wt 228.0 lb

## 2023-12-28 DIAGNOSIS — I480 Paroxysmal atrial fibrillation: Secondary | ICD-10-CM

## 2023-12-28 DIAGNOSIS — Z5181 Encounter for therapeutic drug level monitoring: Secondary | ICD-10-CM

## 2023-12-28 DIAGNOSIS — I10 Essential (primary) hypertension: Secondary | ICD-10-CM

## 2023-12-28 DIAGNOSIS — D6869 Other thrombophilia: Secondary | ICD-10-CM

## 2023-12-28 DIAGNOSIS — I493 Ventricular premature depolarization: Secondary | ICD-10-CM

## 2023-12-28 DIAGNOSIS — Z950 Presence of cardiac pacemaker: Secondary | ICD-10-CM

## 2023-12-28 LAB — CUP PACEART INCLINIC DEVICE CHECK
Battery Remaining Longevity: 128 mo
Battery Voltage: 3.02 V
Brady Statistic AP VP Percent: 73.79 %
Brady Statistic AP VS Percent: 0.09 %
Brady Statistic AS VP Percent: 24.63 %
Brady Statistic AS VS Percent: 1.48 %
Brady Statistic RA Percent Paced: 74.75 %
Brady Statistic RV Percent Paced: 98.42 %
Date Time Interrogation Session: 20251209173357
Implantable Lead Connection Status: 753985
Implantable Lead Connection Status: 753985
Implantable Lead Implant Date: 20221005
Implantable Lead Implant Date: 20221005
Implantable Lead Location: 753859
Implantable Lead Location: 753860
Implantable Lead Model: 3830
Implantable Lead Model: 4574
Implantable Pulse Generator Implant Date: 20221005
Lead Channel Impedance Value: 399 Ohm
Lead Channel Impedance Value: 418 Ohm
Lead Channel Impedance Value: 437 Ohm
Lead Channel Impedance Value: 570 Ohm
Lead Channel Pacing Threshold Amplitude: 0.375 V
Lead Channel Pacing Threshold Amplitude: 0.625 V
Lead Channel Pacing Threshold Pulse Width: 0.4 ms
Lead Channel Pacing Threshold Pulse Width: 0.4 ms
Lead Channel Sensing Intrinsic Amplitude: 31.625 mV
Lead Channel Sensing Intrinsic Amplitude: 31.625 mV
Lead Channel Sensing Intrinsic Amplitude: 5.375 mV
Lead Channel Sensing Intrinsic Amplitude: 5.5 mV
Lead Channel Setting Pacing Amplitude: 1.5 V
Lead Channel Setting Pacing Amplitude: 2 V
Lead Channel Setting Pacing Pulse Width: 0.4 ms
Lead Channel Setting Sensing Sensitivity: 0.9 mV
Zone Setting Status: 755011
Zone Setting Status: 755011

## 2023-12-28 NOTE — Patient Instructions (Addendum)
 Medication Instructions:    MAKE SURE YOU  CALL  BACK   AND TELL DOSE OF SOTALOL  *If you need a refill on your cardiac medications before your next appointment, please call your pharmacy*   Lab Work:   PLEASE GO DOWN STAIRS  [facility]  FIRST FLOOR   ( GET OFF ELEVATORS WALK TOWARDS WAITING AREA LAB LOCATED BY PHARMACY):  BMET AND  MAG TODAY      If you have labs (blood work) drawn today and your tests are completely normal, you will receive your results only by: MyChart Message (if you have MyChart) OR A paper copy in the mail If you have any lab test that is abnormal or we need to change your treatment, we will call you to review the results.   Testing/Procedures:  NONE ORDERED  TODAY    Follow-Up: At [facility], you and your health needs are our priority.  As part of our continuing mission to provide you with exceptional heart care, our providers are all part of one team.  This team includes your primary Cardiologist (physician) and Advanced Practice Providers or APPs (Physician Assistants and Nurse Practitioners) who all work together to provide you with the care you need, when you need it.  Your next appointment:   6 month(s)  Provider:   Charlies Arthur, PA-C  We recommend signing up for the patient portal called MyChart.  Sign up information is provided on this After Visit Summary.  MyChart is used to connect with patients for Virtual Visits (Telemedicine).  Patients are able to view lab/test results, encounter notes, upcoming appointments, etc.  Non-urgent messages can be sent to your provider as well.   To learn more about what you can do with MyChart, go to [email or web address].   Other Instructions

## 2023-12-29 ENCOUNTER — Ambulatory Visit: Payer: Self-pay | Admitting: Physician Assistant

## 2023-12-29 LAB — MAGNESIUM: Magnesium: 2 mg/dL (ref 1.6–2.3)

## 2023-12-29 LAB — BASIC METABOLIC PANEL WITH GFR
BUN/Creatinine Ratio: 15 (ref 10–24)
BUN: 15 mg/dL (ref 8–27)
CO2: 23 mmol/L (ref 20–29)
Calcium: 9 mg/dL (ref 8.6–10.2)
Chloride: 101 mmol/L (ref 96–106)
Creatinine, Ser: 0.97 mg/dL (ref 0.76–1.27)
Glucose: 91 mg/dL (ref 70–99)
Potassium: 4.4 mmol/L (ref 3.5–5.2)
Sodium: 139 mmol/L (ref 134–144)
eGFR: 79 mL/min/1.73 (ref >59)

## 2024-03-06 ENCOUNTER — Ambulatory Visit

## 2024-03-27 ENCOUNTER — Encounter

## 2024-06-05 ENCOUNTER — Ambulatory Visit

## 2024-06-26 ENCOUNTER — Encounter

## 2024-09-04 ENCOUNTER — Ambulatory Visit

## 2024-09-26 ENCOUNTER — Encounter

## 2024-12-25 ENCOUNTER — Encounter

## 2025-03-26 ENCOUNTER — Encounter

## 2025-06-25 ENCOUNTER — Encounter
